# Patient Record
Sex: Male | Born: 2000
Health system: Southern US, Community
[De-identification: ages and names within clinical notes are randomized; demographics above are authoritative.]

## PROBLEM LIST (undated history)

## (undated) DIAGNOSIS — F419 Anxiety disorder, unspecified: Secondary | ICD-10-CM

## (undated) HISTORY — DX: Anxiety disorder, unspecified: F41.9

## (undated) HISTORY — PX: APPENDECTOMY: SHX54

---

## 2021-01-08 ENCOUNTER — Encounter: Payer: Self-pay | Admitting: Nurse Practitioner

## 2021-01-08 ENCOUNTER — Other Ambulatory Visit: Payer: Self-pay

## 2021-01-08 ENCOUNTER — Ambulatory Visit: Payer: Federal, State, Local not specified - PPO | Admitting: Nurse Practitioner

## 2021-01-08 VITALS — BP 134/80 | HR 95 | Temp 98.0°F | Ht 71.0 in | Wt 164.4 lb

## 2021-01-08 DIAGNOSIS — F411 Generalized anxiety disorder: Secondary | ICD-10-CM

## 2021-01-08 DIAGNOSIS — F410 Panic disorder [episodic paroxysmal anxiety] without agoraphobia: Secondary | ICD-10-CM

## 2021-01-08 MED ORDER — SERTRALINE HCL 25 MG PO TABS: 25.0000 mg | ORAL_TABLET | Freq: Every day | ORAL | 2 refills | Status: DC

## 2021-01-08 MED ORDER — HYDROXYZINE HCL 25 MG PO TABS: 25.0000 mg | ORAL_TABLET | Freq: Three times a day (TID) | ORAL | 0 refills | Status: DC | PRN

## 2021-01-08 NOTE — Patient Instructions (Signed)
Thank you for choosing [facility].  You will be contacted to schedule an appt with therapist  I instructed pt to start 1/2 tablet once daily for 1 week and then increase to a full tablet once daily continuously. Take with food. We discussed common side effects such as nausea, drowsiness and weight gain.  Also discussed rare but serious side effect of suicide ideation.  She is instructed to discontinue medication go directly to ED if this occurs.  Pt verbalizes understanding.   Plan follow up in 2weeks to evaluate progress.    Managing Panic Attacks, Teen A panic attack is a sudden and severe episode of anxiety along with physical symptoms like sweating, shaking, and shortness of breath. During a panic attack, you may feel like you are having a heart attack or that you cannot breathe. A panic attack may last 5-10 minutes. These attacks may come on suddenly without any obvious cause and then pass. It is important to know that you can learn ways to manage panic attacks. If you have frequent panic attacks, you may have a panic disorder. Part of having a panic disorder is being constantly afraid of having another panic attack. Sometimes medicines may be used to treat panic attacks or panic disorder in teens. How to recognize a panic attack To be diagnosed with panic attacks, you must have at least four of the following physical or emotional symptoms, and they must start suddenly. Physical symptoms:  Pounding heartbeats, chest pain or pressure, and shortness of breath.  A choking sensation.  Sweating or redness in the face or chest (hot flushing).  Shaking, trembling, or chills.  Nausea or indigestion.  Dizziness.  Numbness and tingling. Emotional symptoms:  Feeling confused or out of body.  Fear of dying or going crazy.  Worried, nervous, and feeling out of control.  Fear of having another panic attack. How to manage a panic attack If you have a panic attack, it is  important to remember that panic attacks do not last long and are not dangerous. After a panic attack, talk about your fears and anxiety with a parent or another trusted adult. Over time and with support, you can learn ways to manage your panic attacks. Suggestions for managing your anxiety and panic attacks include:  Remembering to take deep breaths during a panic attack. You are not in physical danger.  Talking to a trusted adult. Sometimes, a friend's parent, a Runner, broadcasting/film/video, or a coach will find resources to get you the help you need.  Identifying problems you need help fixing, such as being bullied at school.  Learning to recognize things that may lead to panic attacks (triggers). Once you know your triggers, talk about them and find new ways to deal with them.  Making time to do relaxing activities, such as listening to music or reading.  Limiting the amount of time you spend on social media.  Being physically active. Exercise is a good way to manage stress and fear. Try going for a walk or getting involved in an organized sport.  Finding things that help you relax, like yoga, deep breathing, and meditation.   Follow these instructions at home: Eating and drinking  Eat a healthy diet. ? Include foods that are high in fiber, such as beans, whole grains, and fresh fruits and vegetables. ? Limit foods that are high in fat and processed sugars, such as fried or sweet foods.  Limit caffeine.   Activity  Do your normal activities as told  by your health care provider.  Ask your health care provider to suggest some activities.  Try activities that reduce stress and anxiety.  Be physically active every day. General instructions  Take over-the-counter and prescription medicines only as told by your health care provider.  Get enough sleep and try to keep a regular schedule.  Do not smoke or use alcohol or drugs to manage panic attacks.  Keep all follow-up visits as told by your health  care provider. This is important. Where to find support A mental health care provider, like a psychologist or psychiatrist, can help you learn the skills to manage panic attacks. Where to find more information Go to these websites to find more information about how to manage panic attacks:  American Academy of Pediatrics: healthychildren.org  American Academy of Child & Adolescent Psychiatry: [employer]or[employer].es  [facility]: childmind.org Contact a health care provider if:  You keep having panic attacks.  You have signs of anxiety or a panic disorder.  Your panic attacks interfere with your ability to function at home, at school, or with friends.  You are using drugs or alcohol. Get help right away if:  You may be a danger to yourself or others.  You have thoughts about death or suicide. If you ever feel like you may hurt yourself or others, or have thoughts about taking your own life, get help right away. Go to your nearest emergency department or:  Call your local emergency services (911 in the [place]  Call a suicide crisis helpline, such as the National Suicide Prevention Lifeline at (228)343-4766. This is open 24 hours a day in the [place]  Text the Crisis Text Line at 4636768892 (in the [place] Summary  A panic attack is a sudden and severe episode of anxiety along with physical symptoms like sweating, shaking, and shortness of breath.  During a panic attack, the most important thing to remember is that panic attacks do not last long and are not dangerous.  If you have frequent panic attacks, you may have a panic disorder.  It is possible to learn ways to manage panic attacks. Your parents or another trusted adult can help. This information is not intended to replace advice given to you by your health care provider. Make sure you discuss any questions you have with your health care provider. Document Revised: 08/03/2019 Document Reviewed: 08/03/2019 Elsevier Patient  Education  2021 [employer].

## 2021-01-08 NOTE — Progress Notes (Signed)
Subjective:  Patient ID: Willie Mcdaniel, male    DOB: [birth year]  Age: 20 y.o. MRN: [id number]  CC: Establish Care (New patient/Pt states he would like to discuss anxiety. Pt states he experienced what he thinks is a panic attack 10/31/2020 and he his noticed his anxiety more since that initial attack. )  moved from [place], works at [employer], lives with his father at this time. previous use of marijuana and Molly, last used 10/2020.  Anxiety Presents for initial visit. Onset was 1 to 5 years ago. The problem has been gradually worsening. Symptoms include chest pain, depressed mood, dizziness, excessive worry, hyperventilation, muscle tension, nervous/anxious behavior, obsessions, palpitations, panic and restlessness. Patient reports no compulsions, confusion, decreased concentration, dry mouth, feeling of choking, insomnia, irritability, malaise, nausea, shortness of breath or suicidal ideas. Symptoms occur occasionally. The most recent episode lasted 10 minutes. The severity of symptoms is causing significant distress, interfering with daily activities and incapacitating. The symptoms are aggravated by social activities (use of [employer] and marijuana).   Risk factors include family history and illicit drug use. His past medical history is significant for anxiety/panic attacks and depression. There is no history of bipolar disorder, hyperthyroidism or suicide attempts. Past treatments include nothing.  no improvement with vistaril  Depression screen [facility] 2/9 01/08/2021  Decreased Interest 2  Down, Depressed, Hopeless 2  PHQ - 2 Score 4  Altered sleeping 3  Tired, decreased energy 2  Change in appetite 3  Feeling bad or failure about yourself  3  Trouble concentrating 3  Moving slowly or fidgety/restless 1  Suicidal thoughts 0  PHQ-9 Score 19  Difficult doing work/chores Very difficult   GAD 7 : Generalized Anxiety Score 01/08/2021  Nervous, Anxious, on Edge 3  Control/stop worrying  3  Worry too much - different things 3  Trouble relaxing 3  Restless 3  Easily annoyed or irritable 2  Afraid - awful might happen 3  Total GAD 7 Score 20  Anxiety Difficulty Very difficult   Social History   Socioeconomic History  . Marital status: Single    Spouse name: Not on file  . Number of children: Not on file  . Years of education: Not on file  . Highest education level: Not on file  Occupational History  . Not on file  Tobacco Use  . Smoking status: Never Smoker  . Smokeless tobacco: Never Used  Vaping Use  . Vaping Use: Some days  Substance and Sexual Activity  . Alcohol use: Never  . Drug use: Never  . Sexual activity: Not Currently  Other Topics Concern  . Not on file  Social History Narrative  . Not on file   Social Determinants of Health   Financial Resource Strain: Not on file  Food Insecurity: Not on file  Transportation Needs: Not on file  Physical Activity: Not on file  Stress: Not on file  Social Connections: Not on file  Intimate Partner Violence: Not on file   Family History  Problem Relation Age of Onset  . Hypertension Mother   . Hypertension Father   . Hyperlipidemia Father   . Anxiety disorder Father   . Depression Father   . Alcohol abuse Father   . Migraines Father   . Depression Paternal Aunt   . Alcohol abuse Paternal Uncle   . Depression Paternal Uncle     Outpatient Medications Prior to Visit  Medication Sig Dispense Refill  . hydrOXYzine (ATARAX/VISTARIL) 25 MG tablet Take 1  tablet by mouth every 8 (eight) hours as needed. (Patient not taking: Reported on 01/08/2021)     No facility-administered medications prior to visit.   Social History   Socioeconomic History  . Marital status: Single    Spouse name: Not on file  . Number of children: Not on file  . Years of education: Not on file  . Highest education level: Not on file  Occupational History  . Not on file  Tobacco Use  . Smoking status: Never Smoker  .  Smokeless tobacco: Never Used  Vaping Use  . Vaping Use: Some days  Substance and Sexual Activity  . Alcohol use: Never  . Drug use: Never  . Sexual activity: Not Currently  Other Topics Concern  . Not on file  Social History Narrative  . Not on file   Social Determinants of Health   Financial Resource Strain: Not on file  Food Insecurity: Not on file  Transportation Needs: Not on file  Physical Activity: Not on file  Stress: Not on file  Social Connections: Not on file  Intimate Partner Violence: Not on file   ROS See HPI  Objective:  BP 134/80 (BP Location: Left Arm, Patient Position: Sitting, Cuff Size: Large)   Pulse 95   Temp 98 F (36.7 C) (Temporal)   Ht 5\' 11"  (1.803 m)   Wt 164 lb 6.4 oz (74.6 kg)   SpO2 98%   BMI 22.93 kg/m   Physical Exam  Assessment & Plan:  This visit occurred during the SARS-CoV-2 public health emergency.  Safety protocols were in place, including screening questions prior to the visit, additional usage of staff PPE, and extensive cleaning of exam room while observing appropriate contact time as indicated for disinfecting solutions.   Yvonne was seen today for establish care.  Diagnoses and all orders for this visit:  Generalized anxiety disorder with panic attacks -     sertraline (ZOLOFT) 25 MG tablet; Take 1 tablet (25 mg total) by mouth daily. -     Ambulatory referral to Psychology -     hydrOXYzine (ATARAX/VISTARIL) 25 MG tablet; Take 1 tablet (25 mg total) by mouth every 8 (eight) hours as needed.    Problem List Items Addressed This Visit   None   Visit Diagnoses    Generalized anxiety disorder with panic attacks    -  Primary   Relevant Medications   sertraline (ZOLOFT) 25 MG tablet   hydrOXYzine (ATARAX/VISTARIL) 25 MG tablet   Other Relevant Orders   Ambulatory referral to Psychology      I have spent [place] Booty with this patient regarding history taking, documentation, review of ED notes and labs, formulating plan  and discussing treatment options with patient.  Follow-up: Return in about 1 week (around 01/15/2021) for anxiety (03/15/2021).  , NP

## 2021-01-16 ENCOUNTER — Ambulatory Visit: Payer: Federal, State, Local not specified - PPO | Admitting: Nurse Practitioner

## 2021-01-16 ENCOUNTER — Encounter: Payer: Self-pay | Admitting: Nurse Practitioner

## 2021-01-16 ENCOUNTER — Other Ambulatory Visit: Payer: Self-pay

## 2021-01-16 VITALS — BP 124/84 | HR 84 | Temp 97.7°F | Ht 71.0 in | Wt 166.4 lb

## 2021-01-16 DIAGNOSIS — R1013 Epigastric pain: Secondary | ICD-10-CM | POA: Diagnosis not present

## 2021-01-16 DIAGNOSIS — F410 Panic disorder [episodic paroxysmal anxiety] without agoraphobia: Secondary | ICD-10-CM | POA: Diagnosis not present

## 2021-01-16 DIAGNOSIS — F411 Generalized anxiety disorder: Secondary | ICD-10-CM | POA: Diagnosis not present

## 2021-01-16 DIAGNOSIS — K5900 Constipation, unspecified: Secondary | ICD-10-CM | POA: Diagnosis not present

## 2021-01-16 MED ORDER — POLYETHYLENE GLYCOL 3350 17 GM/SCOOP PO POWD: 17.0000 g | Freq: Every day | ORAL | 1 refills | Status: DC

## 2021-01-16 MED ORDER — OMEPRAZOLE 20 MG PO CPDR: DELAYED_RELEASE_CAPSULE | ORAL | 0 refills | Status: DC

## 2021-01-16 NOTE — Patient Instructions (Signed)
Continue zoloft Start miralax and omeprazole Make dietary changes as discussed  Constipation, Adult Constipation is when a person has fewer than three bowel movements in a week, has difficulty having a bowel movement, or has stools (feces) that are dry, hard, or larger than normal. Constipation may be caused by an underlying condition. It may become worse with age if a person takes certain medicines and does not take in enough fluids. Follow these instructions at home: Eating and drinking  Eat foods that have a lot of fiber, such as beans, whole grains, and fresh fruits and vegetables.  Limit foods that are low in fiber and high in fat and processed sugars, such as fried or sweet foods. These include french fries, hamburgers, cookies, candies, and soda.  Drink enough fluid to keep your urine pale yellow.   General instructions  Exercise regularly or as told by your health care provider. Try to do 150 minutes of moderate exercise each week.  Use the bathroom when you have the urge to go. Do not hold it in.  Take over-the-counter and prescription medicines only as told by your health care provider. This includes any fiber supplements.  During bowel movements: ? Practice deep breathing while relaxing the lower abdomen. ? Practice pelvic floor relaxation.  Watch your condition for any changes. Let your health care provider know about them.  Keep all follow-up visits as told by your health care provider. This is important. Contact a health care provider if:  You have pain that gets worse.  You have a fever.  You do not have a bowel movement after 4 days.  You vomit.  You are not hungry or you lose weight.  You are bleeding from the opening between the buttocks (anus).  You have thin, pencil-like stools. Get help right away if:  You have a fever and your symptoms suddenly get worse.  You leak stool or have blood in your stool.  Your abdomen is bloated.  You have severe  pain in your abdomen.  You feel dizzy or you faint. Summary  Constipation is when a person has fewer than three bowel movements in a week, has difficulty having a bowel movement, or has stools (feces) that are dry, hard, or larger than normal.  Eat foods that have a lot of fiber, such as beans, whole grains, and fresh fruits and vegetables.  Drink enough fluid to keep your urine pale yellow.  Take over-the-counter and prescription medicines only as told by your health care provider. This includes any fiber supplements. This information is not intended to replace advice given to you by your health care provider. Make sure you discuss any questions you have with your health care provider. Document Revised: 10/18/2019 Document Reviewed: 10/18/2019 Elsevier Patient Education  2021 [employer].

## 2021-01-16 NOTE — Progress Notes (Signed)
Subjective:  Patient ID: Willie Mcdaniel, male    DOB: 07/06/2001  Age: 20 y.o. MRN: [id number]  CC: Follow-up (1 week f/u regarding anxiety and depression)  Constipation This is a new problem. The current episode started more than 1 month ago. The problem is unchanged. His stool frequency is 2 to 3 times per week. The stool is described as pellet like and firm. The patient is on a high fiber diet. He does not exercise regularly. There has been adequate water intake. Associated symptoms include abdominal pain, bloating, flatus and nausea. Pertinent negatives include no anorexia, back pain, diarrhea, difficulty urinating, fecal incontinence, fever, hematochezia, hemorrhoids, melena, rectal pain, vomiting or weight loss. Risk factors include stress. He has tried laxatives and fiber for the symptoms. The treatment provided no relief. His past medical history is significant for psychiatric history. There is no history of abdominal surgery, endocrine disease, inflammatory bowel disease, irritable bowel syndrome, metabolic disease, neurologic disease, neuromuscular disease or radiation treatment.  Gastroesophageal Reflux He complains of abdominal pain, belching, chest pain, globus sensation, heartburn and nausea. He reports no choking, no coughing, no dysphagia, no early satiety, no hoarse voice, no sore throat, no stridor, no tooth decay, no water brash or no wheezing. This is a recurrent problem. The current episode started more than 1 month ago. The problem occurs constantly. The problem has been waxing and waning. The heartburn is located in the substernum. The heartburn is of moderate intensity. The heartburn does not wake him from sleep. The heartburn does not limit his activity. Nothing aggravates the symptoms. Pertinent negatives include no anemia, fatigue, melena, muscle weakness, orthopnea or weight loss. Risk factors include ETOH use, smoking/tobacco exposure, lack of exercise, NSAIDs and caffeine use.  He has tried an antacid for the symptoms. The treatment provided no relief. Past procedures do not include an abdominal ultrasound, an EGD, esophageal manometry, esophageal pH monitoring, H. pylori antibody titer or a UGI. Past invasive treatments do not include gastroplasty, gastroplication or reflux surgery.   Reviewed past Medical, Social and Family history today.  Outpatient Medications Prior to Visit  Medication Sig Dispense Refill  . hydrOXYzine (ATARAX/VISTARIL) 25 MG tablet Take 1 tablet (25 mg total) by mouth every 8 (eight) hours as needed. 30 tablet 0  . sertraline (ZOLOFT) 25 MG tablet Take 1 tablet (25 mg total) by mouth daily. 30 tablet 2   No facility-administered medications prior to visit.    ROS See HPI  Objective:  BP 124/84 (BP Location: Left Arm, Patient Position: Sitting, Cuff Size: Normal)   Pulse 84   Temp 97.7 F (36.5 C) (Temporal)   Ht 5\' 11"  (1.803 m)   Wt 166 lb 6.4 oz (75.5 kg)   SpO2 98%   BMI 23.21 kg/m   Physical Exam Constitutional:      General: He is not in acute distress. Cardiovascular:     Rate and Rhythm: Normal rate.     Pulses: Normal pulses.  Pulmonary:     Effort: Pulmonary effort is normal.  Abdominal:     General: Bowel sounds are normal.     Palpations: Abdomen is soft.     Tenderness: There is no abdominal tenderness. There is no right CVA tenderness, left CVA tenderness or guarding.  Skin:    Findings: No erythema or rash.  Neurological:     Mental Status: He is alert and oriented to person, place, and time.    Assessment & Plan:  This visit occurred during the SARS-CoV-2  public health emergency.  Safety protocols were in place, including screening questions prior to the visit, additional usage of staff PPE, and extensive cleaning of exam room while observing appropriate contact time as indicated for disinfecting solutions.   Willie Mcdaniel was seen today for follow-up.  Diagnoses and all orders for this visit:  Dyspepsia -      omeprazole (PRILOSEC) 20 MG capsule; Take 1 capsule (20 mg total) by mouth 2 (two) times daily before a meal for 14 days, THEN 1 capsule (20 mg total) daily for 14 days.  Generalized anxiety disorder with panic attacks  Constipation, unspecified constipation type -     polyethylene glycol powder (GLYCOLAX/MIRALAX) 17 GM/SCOOP powder; Take 17 g by mouth daily.   Problem List Items Addressed This Visit      Other   Generalized anxiety disorder with panic attacks    Other Visit Diagnoses    Dyspepsia    -  Primary   Relevant Medications   omeprazole (PRILOSEC) 20 MG capsule   Constipation, unspecified constipation type       Relevant Medications   polyethylene glycol powder (GLYCOLAX/MIRALAX) 17 GM/SCOOP powder      Follow-up: Return in about 4 weeks (around 02/13/2021) for Anxiety, constipation and dyspepsia ( ).  Alysia Penna, NP

## 2021-01-23 DIAGNOSIS — K080 Exfoliation of teeth due to systemic causes: Secondary | ICD-10-CM | POA: Diagnosis not present

## 2021-02-17 ENCOUNTER — Ambulatory Visit: Payer: Federal, State, Local not specified - PPO | Admitting: Nurse Practitioner

## 2021-02-17 ENCOUNTER — Other Ambulatory Visit: Payer: Self-pay

## 2021-02-17 ENCOUNTER — Encounter: Payer: Self-pay | Admitting: Nurse Practitioner

## 2021-02-17 VITALS — BP 118/84 | HR 76 | Temp 97.7°F | Ht 71.0 in | Wt 172.2 lb

## 2021-02-17 DIAGNOSIS — M5386 Other specified dorsopathies, lumbar region: Secondary | ICD-10-CM | POA: Diagnosis not present

## 2021-02-17 DIAGNOSIS — F410 Panic disorder [episodic paroxysmal anxiety] without agoraphobia: Secondary | ICD-10-CM | POA: Diagnosis not present

## 2021-02-17 DIAGNOSIS — M5382 Other specified dorsopathies, cervical region: Secondary | ICD-10-CM | POA: Diagnosis not present

## 2021-02-17 DIAGNOSIS — R1013 Epigastric pain: Secondary | ICD-10-CM | POA: Diagnosis not present

## 2021-02-17 DIAGNOSIS — F411 Generalized anxiety disorder: Secondary | ICD-10-CM

## 2021-02-17 DIAGNOSIS — M9901 Segmental and somatic dysfunction of cervical region: Secondary | ICD-10-CM | POA: Diagnosis not present

## 2021-02-17 DIAGNOSIS — M9903 Segmental and somatic dysfunction of lumbar region: Secondary | ICD-10-CM | POA: Diagnosis not present

## 2021-02-17 MED ORDER — SERTRALINE HCL 50 MG PO TABS: 50.0000 mg | ORAL_TABLET | Freq: Every day | ORAL | 5 refills | Status: DC

## 2021-02-17 MED ORDER — OMEPRAZOLE 20 MG PO CPDR: 20.0000 mg | DELAYED_RELEASE_CAPSULE | Freq: Every day | ORAL | 5 refills | Status: DC

## 2021-02-17 NOTE — Patient Instructions (Signed)
Increase zoloft to 50mg  daily Schedule appt with psychology: [phone].

## 2021-02-17 NOTE — Progress Notes (Signed)
Subjective:  Patient ID: Willie Mcdaniel, male    DOB: [birth year]  Age: 20 y.o. MRN: [id number]  CC: Follow-up (4 week f/u on anxiety, constipation and dyspepsia. Pt states he feels like he has some circulation issues because since going to the gym more noticed some swelling and discomfort in forearms when weight lifting. )  HPI  Generalized anxiety disorder with panic attacks No change with zoloft 25mg  No SI/HI/psychosis. Did not schedule appt with therapist.  Increase zoloft to 50mg  Advised to schedule appt with therapist. F/up in 30month  Dyspepsia Improved with omeprazole Refill sent  Depression screen [facility] 2/9 01/08/2021  Decreased Interest 2  Down, Depressed, Hopeless 2  PHQ - 2 Score 4  Altered sleeping 3  Tired, decreased energy 2  Change in appetite 3  Feeling bad or failure about yourself  3  Trouble concentrating 3  Moving slowly or fidgety/restless 1  Suicidal thoughts 0  PHQ-9 Score 19  Difficult doing work/chores Very difficult   GAD 7 : Generalized Anxiety Score 01/08/2021  Nervous, Anxious, on Edge 3  Control/stop worrying 3  Worry too much - different things 3  Trouble relaxing 3  Restless 3  Easily annoyed or irritable 2  Afraid - awful might happen 3  Total GAD 7 Score 20  Anxiety Difficulty Very difficult   Reviewed past Medical, Social and Family history today.  Outpatient Medications Prior to Visit  Medication Sig Dispense Refill  . hydrOXYzine (ATARAX/VISTARIL) 25 MG tablet Take 1 tablet (25 mg total) by mouth every 8 (eight) hours as needed. 30 tablet 0  . polyethylene glycol powder (GLYCOLAX/MIRALAX) 17 GM/SCOOP powder Take 17 g by mouth daily. 3350 g 1  . sertraline (ZOLOFT) 25 MG tablet Take 1 tablet (25 mg total) by mouth daily. 30 tablet 2  . omeprazole (PRILOSEC) 20 MG capsule Take 1 capsule (20 mg total) by mouth 2 (two) times daily before a meal for 14 days, THEN 1 capsule (20 mg total) daily for 14 days. 60 capsule 0   No  facility-administered medications prior to visit.    ROS See HPI  Objective:  BP 118/84 (BP Location: Left Arm, Patient Position: Sitting, Cuff Size: Large)   Pulse 76   Temp 97.7 F (36.5 C) (Temporal)   Ht 5\' 11"  (1.803 m)   Wt 172 lb 3.2 oz (78.1 kg)   SpO2 98%   BMI 24.02 kg/m   Physical Exam Cardiovascular:     Rate and Rhythm: Normal rate.     Pulses: Normal pulses.  Musculoskeletal:        General: No deformity. Normal range of motion.     Right lower leg: No edema.     Left lower leg: No edema.  Skin:    General: Skin is warm and dry.  Neurological:     Mental Status: He is alert and oriented to person, place, and time.  Psychiatric:        Attention and Perception: Attention normal.        Mood and Affect: Mood is anxious.        Speech: Speech normal.        Behavior: Behavior is cooperative.        Thought Content: Thought content normal.        Cognition and Memory: Cognition normal.    Assessment & Plan:  This visit occurred during the SARS-CoV-2 public health emergency.  Safety protocols were in place, including screening questions prior to the visit, additional  usage of staff PPE, and extensive cleaning of exam room while observing appropriate contact time as indicated for disinfecting solutions.   Willie Mcdaniel was seen today for follow-up.  Diagnoses and all orders for this visit:  Generalized anxiety disorder with panic attacks -     sertraline (ZOLOFT) 50 MG tablet; Take 1 tablet (50 mg total) by mouth daily.  Dyspepsia -     omeprazole (PRILOSEC) 20 MG capsule; Take 1 capsule (20 mg total) by mouth daily.   Problem List Items Addressed This Visit      Other   Dyspepsia    Improved with omeprazole Refill sent      Relevant Medications   omeprazole (PRILOSEC) 20 MG capsule   Generalized anxiety disorder with panic attacks - Primary    No change with zoloft 25mg  No SI/HI/psychosis. Did not schedule appt with therapist.  Increase zoloft to  50mg  Advised to schedule appt with therapist. F/up in 49month      Relevant Medications   sertraline (ZOLOFT) 50 MG tablet      Follow-up: Return in about 4 weeks (around 03/17/2021) for CPE (fasting).  2month, NP

## 2021-02-17 NOTE — Assessment & Plan Note (Signed)
No change with zoloft 25mg  No SI/HI/psychosis. Did not schedule appt with therapist.  Increase zoloft to 50mg  Advised to schedule appt with therapist. F/up in 31month

## 2021-02-17 NOTE — Assessment & Plan Note (Signed)
Improved with omeprazole Refill sent

## 2021-02-20 DIAGNOSIS — M9903 Segmental and somatic dysfunction of lumbar region: Secondary | ICD-10-CM | POA: Diagnosis not present

## 2021-02-20 DIAGNOSIS — M5386 Other specified dorsopathies, lumbar region: Secondary | ICD-10-CM | POA: Diagnosis not present

## 2021-02-20 DIAGNOSIS — M5382 Other specified dorsopathies, cervical region: Secondary | ICD-10-CM | POA: Diagnosis not present

## 2021-02-20 DIAGNOSIS — M9901 Segmental and somatic dysfunction of cervical region: Secondary | ICD-10-CM | POA: Diagnosis not present

## 2021-02-24 DIAGNOSIS — M5382 Other specified dorsopathies, cervical region: Secondary | ICD-10-CM | POA: Diagnosis not present

## 2021-02-24 DIAGNOSIS — M9903 Segmental and somatic dysfunction of lumbar region: Secondary | ICD-10-CM | POA: Diagnosis not present

## 2021-02-24 DIAGNOSIS — M5386 Other specified dorsopathies, lumbar region: Secondary | ICD-10-CM | POA: Diagnosis not present

## 2021-02-24 DIAGNOSIS — M9901 Segmental and somatic dysfunction of cervical region: Secondary | ICD-10-CM | POA: Diagnosis not present

## 2021-02-25 DIAGNOSIS — M5382 Other specified dorsopathies, cervical region: Secondary | ICD-10-CM | POA: Diagnosis not present

## 2021-02-25 DIAGNOSIS — M9901 Segmental and somatic dysfunction of cervical region: Secondary | ICD-10-CM | POA: Diagnosis not present

## 2021-02-25 DIAGNOSIS — M9903 Segmental and somatic dysfunction of lumbar region: Secondary | ICD-10-CM | POA: Diagnosis not present

## 2021-02-25 DIAGNOSIS — M5386 Other specified dorsopathies, lumbar region: Secondary | ICD-10-CM | POA: Diagnosis not present

## 2021-02-27 DIAGNOSIS — M9901 Segmental and somatic dysfunction of cervical region: Secondary | ICD-10-CM | POA: Diagnosis not present

## 2021-02-27 DIAGNOSIS — M5382 Other specified dorsopathies, cervical region: Secondary | ICD-10-CM | POA: Diagnosis not present

## 2021-02-27 DIAGNOSIS — M5386 Other specified dorsopathies, lumbar region: Secondary | ICD-10-CM | POA: Diagnosis not present

## 2021-02-27 DIAGNOSIS — M9903 Segmental and somatic dysfunction of lumbar region: Secondary | ICD-10-CM | POA: Diagnosis not present

## 2021-03-03 DIAGNOSIS — M9901 Segmental and somatic dysfunction of cervical region: Secondary | ICD-10-CM | POA: Diagnosis not present

## 2021-03-03 DIAGNOSIS — M9903 Segmental and somatic dysfunction of lumbar region: Secondary | ICD-10-CM | POA: Diagnosis not present

## 2021-03-03 DIAGNOSIS — M5382 Other specified dorsopathies, cervical region: Secondary | ICD-10-CM | POA: Diagnosis not present

## 2021-03-03 DIAGNOSIS — M5386 Other specified dorsopathies, lumbar region: Secondary | ICD-10-CM | POA: Diagnosis not present

## 2021-03-05 DIAGNOSIS — M9901 Segmental and somatic dysfunction of cervical region: Secondary | ICD-10-CM | POA: Diagnosis not present

## 2021-03-05 DIAGNOSIS — M5382 Other specified dorsopathies, cervical region: Secondary | ICD-10-CM | POA: Diagnosis not present

## 2021-03-05 DIAGNOSIS — M9903 Segmental and somatic dysfunction of lumbar region: Secondary | ICD-10-CM | POA: Diagnosis not present

## 2021-03-05 DIAGNOSIS — M5386 Other specified dorsopathies, lumbar region: Secondary | ICD-10-CM | POA: Diagnosis not present

## 2021-03-06 DIAGNOSIS — M5386 Other specified dorsopathies, lumbar region: Secondary | ICD-10-CM | POA: Diagnosis not present

## 2021-03-06 DIAGNOSIS — M9901 Segmental and somatic dysfunction of cervical region: Secondary | ICD-10-CM | POA: Diagnosis not present

## 2021-03-06 DIAGNOSIS — M9903 Segmental and somatic dysfunction of lumbar region: Secondary | ICD-10-CM | POA: Diagnosis not present

## 2021-03-06 DIAGNOSIS — M5382 Other specified dorsopathies, cervical region: Secondary | ICD-10-CM | POA: Diagnosis not present

## 2021-03-10 DIAGNOSIS — M9901 Segmental and somatic dysfunction of cervical region: Secondary | ICD-10-CM | POA: Diagnosis not present

## 2021-03-10 DIAGNOSIS — M9903 Segmental and somatic dysfunction of lumbar region: Secondary | ICD-10-CM | POA: Diagnosis not present

## 2021-03-10 DIAGNOSIS — M5382 Other specified dorsopathies, cervical region: Secondary | ICD-10-CM | POA: Diagnosis not present

## 2021-03-10 DIAGNOSIS — M5386 Other specified dorsopathies, lumbar region: Secondary | ICD-10-CM | POA: Diagnosis not present

## 2021-03-13 DIAGNOSIS — M5382 Other specified dorsopathies, cervical region: Secondary | ICD-10-CM | POA: Diagnosis not present

## 2021-03-13 DIAGNOSIS — M9903 Segmental and somatic dysfunction of lumbar region: Secondary | ICD-10-CM | POA: Diagnosis not present

## 2021-03-13 DIAGNOSIS — M9901 Segmental and somatic dysfunction of cervical region: Secondary | ICD-10-CM | POA: Diagnosis not present

## 2021-03-13 DIAGNOSIS — M5386 Other specified dorsopathies, lumbar region: Secondary | ICD-10-CM | POA: Diagnosis not present

## 2021-03-17 DIAGNOSIS — M9901 Segmental and somatic dysfunction of cervical region: Secondary | ICD-10-CM | POA: Diagnosis not present

## 2021-03-17 DIAGNOSIS — M5382 Other specified dorsopathies, cervical region: Secondary | ICD-10-CM | POA: Diagnosis not present

## 2021-03-17 DIAGNOSIS — M5386 Other specified dorsopathies, lumbar region: Secondary | ICD-10-CM | POA: Diagnosis not present

## 2021-03-17 DIAGNOSIS — M9903 Segmental and somatic dysfunction of lumbar region: Secondary | ICD-10-CM | POA: Diagnosis not present

## 2021-03-20 DIAGNOSIS — M9903 Segmental and somatic dysfunction of lumbar region: Secondary | ICD-10-CM | POA: Diagnosis not present

## 2021-03-20 DIAGNOSIS — M5386 Other specified dorsopathies, lumbar region: Secondary | ICD-10-CM | POA: Diagnosis not present

## 2021-03-20 DIAGNOSIS — M9901 Segmental and somatic dysfunction of cervical region: Secondary | ICD-10-CM | POA: Diagnosis not present

## 2021-03-20 DIAGNOSIS — M5382 Other specified dorsopathies, cervical region: Secondary | ICD-10-CM | POA: Diagnosis not present

## 2021-04-02 DIAGNOSIS — M9903 Segmental and somatic dysfunction of lumbar region: Secondary | ICD-10-CM | POA: Diagnosis not present

## 2021-04-02 DIAGNOSIS — M5382 Other specified dorsopathies, cervical region: Secondary | ICD-10-CM | POA: Diagnosis not present

## 2021-04-02 DIAGNOSIS — M5386 Other specified dorsopathies, lumbar region: Secondary | ICD-10-CM | POA: Diagnosis not present

## 2021-04-02 DIAGNOSIS — M9901 Segmental and somatic dysfunction of cervical region: Secondary | ICD-10-CM | POA: Diagnosis not present

## 2021-04-11 ENCOUNTER — Encounter: Payer: Self-pay | Admitting: Nurse Practitioner

## 2021-04-14 NOTE — Telephone Encounter (Signed)
Please see message and advise.  Thank you. Last OV 02/17/21

## 2021-04-17 DIAGNOSIS — M9901 Segmental and somatic dysfunction of cervical region: Secondary | ICD-10-CM | POA: Diagnosis not present

## 2021-04-17 DIAGNOSIS — M5382 Other specified dorsopathies, cervical region: Secondary | ICD-10-CM | POA: Diagnosis not present

## 2021-04-17 DIAGNOSIS — M9903 Segmental and somatic dysfunction of lumbar region: Secondary | ICD-10-CM | POA: Diagnosis not present

## 2021-04-17 DIAGNOSIS — M5386 Other specified dorsopathies, lumbar region: Secondary | ICD-10-CM | POA: Diagnosis not present

## 2021-04-18 ENCOUNTER — Other Ambulatory Visit: Payer: Self-pay

## 2021-04-18 ENCOUNTER — Encounter: Payer: Self-pay | Admitting: Nurse Practitioner

## 2021-04-18 ENCOUNTER — Ambulatory Visit (INDEPENDENT_AMBULATORY_CARE_PROVIDER_SITE_OTHER): Payer: Federal, State, Local not specified - PPO | Admitting: Nurse Practitioner

## 2021-04-18 VITALS — BP 110/60 | HR 80 | Temp 98.4°F | Ht 70.5 in | Wt 180.0 lb

## 2021-04-18 DIAGNOSIS — F411 Generalized anxiety disorder: Secondary | ICD-10-CM | POA: Diagnosis not present

## 2021-04-18 DIAGNOSIS — F410 Panic disorder [episodic paroxysmal anxiety] without agoraphobia: Secondary | ICD-10-CM

## 2021-04-18 DIAGNOSIS — Z0001 Encounter for general adult medical examination with abnormal findings: Secondary | ICD-10-CM

## 2021-04-18 LAB — COMPREHENSIVE METABOLIC PANEL
ALT: 19 U/L (ref 0–53)
AST: 30 U/L (ref 0–37)
Albumin: 4.8 g/dL (ref 3.5–5.2)
Alkaline Phosphatase: 78 U/L (ref 39–117)
BUN: 26 mg/dL — ABNORMAL HIGH (ref 6–23)
CO2: 30 mEq/L (ref 19–32)
Calcium: 10 mg/dL (ref 8.4–10.5)
Chloride: 102 mEq/L (ref 96–112)
Creatinine, Ser: 1.19 mg/dL (ref 0.40–1.50)
GFR: 88.1 mL/min (ref >60.00)
Glucose, Bld: 87 mg/dL (ref 70–99)
Potassium: 3.8 mEq/L (ref 3.5–5.1)
Sodium: 140 mEq/L (ref 135–145)
Total Bilirubin: 0.5 mg/dL (ref 0.2–1.2)
Total Protein: 7.3 g/dL (ref 6.0–8.3)

## 2021-04-18 LAB — CBC
HCT: 45.7 % (ref 39.0–52.0)
Hemoglobin: 16.2 g/dL (ref 13.0–17.0)
MCHC: 35.5 g/dL (ref 30.0–36.0)
MCV: 83 fl (ref 78.0–100.0)
Platelets: 193 10*3/uL (ref 150.0–400.0)
RBC: 5.5 Mil/uL (ref 4.22–5.81)
RDW: 13 % (ref 11.5–14.6)
WBC: 8.2 10*3/uL (ref 4.5–10.5)

## 2021-04-18 LAB — TSH: TSH: 4.68 u[IU]/mL (ref 0.35–5.50)

## 2021-04-18 MED ORDER — BUSPIRONE HCL 7.5 MG PO TABS: 7.5000 mg | ORAL_TABLET | Freq: Two times a day (BID) | ORAL | 5 refills | Status: DC

## 2021-04-18 NOTE — Progress Notes (Signed)
Subjective:    Patient ID: Willie Mcdaniel, male    DOB: 12/20/2000, 20 y.o.   MRN: [id number]  Patient presents today for CPE and eval of chronic conditions  HPI Generalized anxiety disorder with panic attacks Improving with zoloft 50mg  Intermittent panic attacks He is still to schedule appt with therapist. No HI/SI, no hallucination  Advised to schedule appt with therapist. Provided number. Maintain zoloft dose Add buspar 7.5mg  BID F/up in 22month  Sexual History (orientation,birth control, marital status, STD):denies need for STD screen  Depression/Suicide: Depression screen [facility] 2/9 04/18/2021 02/17/2021 01/08/2021  Decreased Interest 1 1 2   Down, Depressed, Hopeless 1 2 2   PHQ - 2 Score 2 3 4   Altered sleeping 2 1 3   Tired, decreased energy 2 3 2   Change in appetite 1 3 3   Feeling bad or failure about yourself  1 1 3   Trouble concentrating 1 2 3   Moving slowly or fidgety/restless 1 1 1   Suicidal thoughts 0 0 0  PHQ-9 Score 10 14 19   Difficult doing work/chores Somewhat difficult Very difficult Very difficult   GAD 7 : Generalized Anxiety Score 04/18/2021 02/17/2021 01/08/2021  Nervous, Anxious, on Edge 3 3 3   Control/stop worrying 1 2 3   Worry too much - different things 2 2 3   Trouble relaxing 2 3 3   Restless 1 1 3   Easily annoyed or irritable 1 3 2   Afraid - awful might happen 1 1 3   Total GAD 7 Score 11 15 20   Anxiety Difficulty Somewhat difficult Very difficult Very difficult   Vision:not needed  Dental:will schedule  Immunizations: (TDAP, Hep C screen, Pneumovax, Influenza, zoster)  Health Maintenance  Topic Date Due  . HPV Vaccine (1 - Male 2-dose series) Never done  . Tetanus Vaccine  Never done  . Hepatitis C Screening: USPSTF Recommendation to screen - Ages 18-79 yo.  04/18/2022*  . HIV Screening  04/18/2022*  . COVID-19 Vaccine (3 - Booster for Moderna series) 07/08/2021  . Flu Shot  07/14/2021  *Topic was postponed. The date shown is not the original due  date.   Diet:regular. Exercise: none Weight:  Wt Readings from Last 3 Encounters:  04/18/21 180 lb (81.6 kg)  02/17/21 172 lb 3.2 oz (78.1 kg)  01/16/21 166 lb 6.4 oz (75.5 kg) (66 %, Z= 0.40)*   * Growth percentiles are based on CDC (Boys, 2-20 Years) data.   Fall Risk: Fall Risk  04/18/2021  Falls in the past year? 0  Number falls in past yr: 0  Injury with Fall? 0  Risk for fall due to : No Fall Risks  Follow up Falls evaluation completed   Medications and allergies reviewed with patient and updated if appropriate.  Patient Active Problem List   Diagnosis Date Noted  . Dyspepsia 02/17/2021  . Generalized anxiety disorder with panic attacks 01/16/2021    Current Outpatient Medications on File Prior to Visit  Medication Sig Dispense Refill  . polyethylene glycol powder (GLYCOLAX/MIRALAX) 17 GM/SCOOP powder Take 17 g by mouth daily. 3350 g 1  . sertraline (ZOLOFT) 50 MG tablet Take 1 tablet (50 mg total) by mouth daily. 30 tablet 5  . omeprazole (PRILOSEC) 20 MG capsule Take 1 capsule (20 mg total) by mouth daily. 30 capsule 5   No current facility-administered medications on file prior to visit.    Past Medical History:  Diagnosis Date  . Anxiety     Past Surgical History:  Procedure Laterality Date  . APPENDECTOMY  Social History   Socioeconomic History  . Marital status: Single    Spouse name: Not on file  . Number of children: Not on file  . Years of education: Not on file  . Highest education level: Not on file  Occupational History  . Not on file  Tobacco Use  . Smoking status: Never Smoker  . Smokeless tobacco: Never Used  Vaping Use  . Vaping Use: Some days  Substance and Sexual Activity  . Alcohol use: Never  . Drug use: Never  . Sexual activity: Not Currently  Other Topics Concern  . Not on file  Social History Narrative  . Not on file   Social Determinants of Health   Financial Resource Strain: Not on file  Food Insecurity: Not on  file  Transportation Needs: Not on file  Physical Activity: Not on file  Stress: Not on file  Social Connections: Not on file    Family History  Problem Relation Age of Onset  . Hypertension Mother   . Hypertension Father   . Hyperlipidemia Father   . Anxiety disorder Father   . Depression Father   . Alcohol abuse Father   . Migraines Father   . Depression Paternal Aunt   . Alcohol abuse Paternal Uncle   . Depression Paternal Uncle         Review of Systems  Constitutional: Negative for fever, malaise/fatigue and weight loss.  HENT: Negative for congestion and sore throat.   Eyes:       Negative for visual changes  Respiratory: Negative for cough and shortness of breath.   Cardiovascular: Negative for chest pain, palpitations and leg swelling.  Gastrointestinal: Negative for blood in stool, constipation, diarrhea and heartburn.  Genitourinary: Negative for dysuria, frequency and urgency.  Musculoskeletal: Negative for falls, joint pain and myalgias.  Skin: Negative for rash.  Neurological: Negative for dizziness, sensory change and headaches.  Endo/Heme/Allergies: Does not bruise/bleed easily.  Psychiatric/Behavioral: Positive for depression. Negative for hallucinations, substance abuse and suicidal ideas. The patient is nervous/anxious.     Objective:   Vitals:   04/18/21 0941  BP: 110/60  Pulse: 80  Temp: 98.4 F (36.9 C)  SpO2: 98%    Body mass index is 25.46 kg/m.   Physical Examination:  Physical Exam Vitals reviewed.  Constitutional:      General: He is not in acute distress.    Appearance: He is well-developed.  HENT:     Right Ear: Tympanic membrane, ear canal and external ear normal.     Left Ear: Tympanic membrane, ear canal and external ear normal.  Eyes:     Extraocular Movements: Extraocular movements intact.     Conjunctiva/sclera: Conjunctivae normal.  Cardiovascular:     Rate and Rhythm: Normal rate and regular rhythm.     Heart  sounds: Normal heart sounds.  Pulmonary:     Effort: Pulmonary effort is normal. No respiratory distress.     Breath sounds: Normal breath sounds.  Chest:     Chest wall: No tenderness.  Abdominal:     General: Bowel sounds are normal.     Palpations: Abdomen is soft.  Musculoskeletal:        General: Normal range of motion.     Cervical back: Normal range of motion and neck supple.  Skin:    General: Skin is warm and dry.  Neurological:     Mental Status: He is alert and oriented to person, place, and time.  Deep Tendon Reflexes: Reflexes are normal and symmetric.  Psychiatric:        Mood and Affect: Mood normal.        Behavior: Behavior normal.        Thought Content: Thought content normal.    ASSESSMENT and PLAN: This visit occurred during the SARS-CoV-2 public health emergency.  Safety protocols were in place, including screening questions prior to the visit, additional usage of staff PPE, and extensive cleaning of exam room while observing appropriate contact time as indicated for disinfecting solutions.   Lanny was seen today for annual exam.  Diagnoses and all orders for this visit:  Generalized anxiety disorder with panic attacks -     busPIRone (BUSPAR) 7.5 MG tablet; Take 1 tablet (7.5 mg total) by mouth 2 (two) times daily.  Encounter for preventative adult health care exam with abnormal findings -     TSH -     CBC -     Comprehensive metabolic panel      Problem List Items Addressed This Visit      Other   Generalized anxiety disorder with panic attacks - Primary    Improving with zoloft 50mg  Intermittent panic attacks He is still to schedule appt with therapist. No HI/SI, no hallucination  Advised to schedule appt with therapist. Provided number. Maintain zoloft dose Add buspar 7.5mg  BID F/up in 79month      Relevant Medications   busPIRone (BUSPAR) 7.5 MG tablet    Other Visit Diagnoses    Encounter for preventative adult health care  exam with abnormal findings       Relevant Orders   TSH   CBC   Comprehensive metabolic panel      Follow up: Return in about 4 weeks (around 05/16/2021) for anxiety and dyspepsia (07/16/2021).  , NP

## 2021-04-18 NOTE — Patient Instructions (Addendum)
Go to lab to get blood draw  Schedule appt with therapist: [phone].  Start buspar  maintain zoloft dose.  Preventive Care 22-20 Years Old, Male Preventive care refers to lifestyle choices and visits with your health care provider that can promote health and wellness. At this stage in your life, you may start seeing a primary care physician instead of a pediatrician. It is important to take responsibility for your health and well-being. Preventive care for young adults includes:  A yearly physical exam. This is also called an annual wellness visit.  Regular dental and eye exams.  Immunizations.  Screening for certain conditions.  Healthy lifestyle choices, such as: ? Eating a healthy diet. ? Getting regular exercise. ? Not using drugs or products that contain nicotine and tobacco. ? Limiting alcohol use. What can I expect for my preventive care visit? Physical exam Your health care provider may check your:  Height and weight. These may be used to calculate your BMI (body mass index). BMI is a measurement that tells if you are at a healthy weight.  Heart rate and blood pressure.  Body temperature.  Skin for abnormal spots. Counseling Your health care provider may ask you questions about your:  Past medical problems.  Family's medical history.  Alcohol, tobacco, and drug use.  Home life and relationship well-being.  Access to firearms.  Emotional well-being.  Diet, exercise, and sleep habits.  Sexual activity and sexual health. What immunizations do I need? Vaccines are usually given at various ages, according to a schedule. Your health care provider will recommend vaccines for you based on your age, medical history, and lifestyle or other factors, such as travel or where you work.   What tests do I need? Blood tests  Lipid and cholesterol levels. These may be checked every 5 years starting at age 79.  Hepatitis C test.  Hepatitis B  test. Screening  Genital exam to check for testicular cancer or hernias.  STD (sexually transmitted disease) testing, if you are at risk. Other tests  Tuberculosis skin test.  Vision and hearing tests.  Skin exam. Talk with your health care provider about your test results, treatment options, and if necessary, the need for more tests. Follow these instructions at home: Eating and drinking  Eat a healthy diet that includes fresh fruits and vegetables, whole grains, lean protein, and low-fat dairy products.  Drink enough fluid to keep your urine pale yellow.  Do not drink alcohol if: ? Your health care provider tells you not to drink. ? You are under the legal drinking age. In the [place] the legal drinking age is 37.  If you drink alcohol: ? Limit how much you use to 0-2 drinks a day. ? Be aware of how much alcohol is in your drink. In the [place] one drink equals one 12 oz bottle of beer (355 mL), one 5 oz glass of wine (148 mL), or one 1 oz glass of hard liquor (44 mL).   Lifestyle  Take daily care of your teeth and gums. Brush your teeth every morning and night with fluoride toothpaste. Floss one time each day.  Stay active. Exercise for at least 30 minutes 5 or more days of the week.  Do not use any products that contain nicotine or tobacco, such as cigarettes, e-cigarettes, and chewing tobacco. If you need help quitting, ask your health care provider.  Do not use drugs.  If you are sexually active, practice safe sex. Use a condom or  other form of protection to prevent STIs (sexually transmitted infections).  Find healthy ways to cope with stress, such as: ? Meditation, yoga, or listening to music. ? Journaling. ? Talking to a trusted person. ? Spending time with friends and family. Safety  Always wear your seat belt while driving or riding in a vehicle.  Do not drive: ? If you have been drinking alcohol. Do not ride with someone who has been drinking. ? When you  are tired or distracted. ? While texting.  Wear a helmet and other protective equipment during sports activities.  If you have firearms in your house, make sure you follow all gun safety procedures.  Seek help if you have been bullied, physically abused, or sexually abused.  Use the Internet responsibly to avoid dangers, such as online bullying and online sex predators. What's next?  Go to your health care provider once a year for an annual wellness visit.  Ask your health care provider how often you should have your eyes and teeth checked.  Stay up to date on all vaccines. This information is not intended to replace advice given to you by your health care provider. Make sure you discuss any questions you have with your health care provider. Document Revised: 08/16/2019 Document Reviewed: 11/24/2018 Elsevier Patient Education  2021 [employer].

## 2021-04-18 NOTE — Assessment & Plan Note (Addendum)
Improving with zoloft 50mg  Intermittent panic attacks He is still to schedule appt with therapist. No HI/SI, no hallucination  Advised to schedule appt with therapist. Provided number. Maintain zoloft dose Add buspar 7.5mg  BID F/up in 48month

## 2021-05-19 DIAGNOSIS — M5386 Other specified dorsopathies, lumbar region: Secondary | ICD-10-CM | POA: Diagnosis not present

## 2021-05-19 DIAGNOSIS — M5382 Other specified dorsopathies, cervical region: Secondary | ICD-10-CM | POA: Diagnosis not present

## 2021-05-19 DIAGNOSIS — M9901 Segmental and somatic dysfunction of cervical region: Secondary | ICD-10-CM | POA: Diagnosis not present

## 2021-05-19 DIAGNOSIS — M9903 Segmental and somatic dysfunction of lumbar region: Secondary | ICD-10-CM | POA: Diagnosis not present

## 2021-05-23 ENCOUNTER — Encounter: Payer: Self-pay | Admitting: Nurse Practitioner

## 2021-05-23 ENCOUNTER — Ambulatory Visit: Payer: Federal, State, Local not specified - PPO | Admitting: Nurse Practitioner

## 2021-05-23 ENCOUNTER — Other Ambulatory Visit: Payer: Self-pay

## 2021-05-23 ENCOUNTER — Ambulatory Visit (INDEPENDENT_AMBULATORY_CARE_PROVIDER_SITE_OTHER): Payer: Federal, State, Local not specified - PPO | Admitting: Psychologist

## 2021-05-23 VITALS — BP 116/72 | HR 58 | Temp 97.2°F | Ht 70.0 in | Wt 172.0 lb

## 2021-05-23 DIAGNOSIS — G44229 Chronic tension-type headache, not intractable: Secondary | ICD-10-CM | POA: Insufficient documentation

## 2021-05-23 DIAGNOSIS — F320 Major depressive disorder, single episode, mild: Secondary | ICD-10-CM

## 2021-05-23 DIAGNOSIS — F411 Generalized anxiety disorder: Secondary | ICD-10-CM

## 2021-05-23 DIAGNOSIS — F410 Panic disorder [episodic paroxysmal anxiety] without agoraphobia: Secondary | ICD-10-CM

## 2021-05-23 MED ORDER — BUSPIRONE HCL 7.5 MG PO TABS: 7.5000 mg | ORAL_TABLET | Freq: Two times a day (BID) | ORAL | 5 refills | Status: DC

## 2021-05-23 MED ORDER — SERTRALINE HCL 50 MG PO TABS: 50.0000 mg | ORAL_TABLET | Freq: Every day | ORAL | 3 refills | Status: DC

## 2021-05-23 MED ORDER — CYCLOBENZAPRINE HCL 5 MG PO TABS
5.0000 mg | ORAL_TABLET | Freq: Every evening | ORAL | 1 refills | Status: DC | PRN
Start: 2021-05-23 — End: 2022-03-04

## 2021-05-23 NOTE — Patient Instructions (Signed)
Use mouth guard at bedtime Do neck and shoulder exercise daily. Continue other medications  Shoulder Exercises Ask your health care provider which exercises are safe for you. Do exercises exactly as told by your health care provider and adjust them as directed. It is normal to feel mild stretching, pulling, tightness, or discomfort as you do these exercises. Stop right away if you feel sudden pain or your pain gets worse. Do not begin these exercises until told by your health care provider. Stretching exercises External rotation and abduction This exercise is sometimes called corner stretch. This exercise rotates your arm outward (external rotation) and moves your arm out from your body (abduction). Stand in a doorway with one of your feet slightly in front of the other. This is called a staggered stance. If you cannot reach your forearms to the door frame, stand facing a corner of a room. Choose one of the following positions as told by your health care provider: Place your hands and forearms on the door frame above your head. Place your hands and forearms on the door frame at the height of your head. Place your hands on the door frame at the height of your elbows. Slowly move your weight onto your front foot until you feel a stretch across your chest and in the front of your shoulders. Keep your head and chest upright and keep your abdominal muscles tight. Hold for __________ seconds. To release the stretch, shift your weight to your back foot. Repeat __________ times. Complete this exercise __________ times a day. Extension, standing Stand and hold a broomstick, a cane, or a similar object behind your back. Your hands should be a little wider than shoulder width apart. Your palms should face away from your back. Keeping your elbows straight and your shoulder muscles relaxed, move the stick away from your body until you feel a stretch in your shoulders (extension). Avoid shrugging your  shoulders while you move the stick. Keep your shoulder blades tucked down toward the middle of your back. Hold for __________ seconds. Slowly return to the starting position. Repeat __________ times. Complete this exercise __________ times a day. Range-of-motion exercises Pendulum  Stand near a wall or a surface that you can hold onto for balance. Bend at the waist and let your left / right arm hang straight down. Use your other arm to support you. Keep your back straight and do not lock your knees. Relax your left / right arm and shoulder muscles, and move your hips and your trunk so your left / right arm swings freely. Your arm should swing because of the motion of your body, not because you are using your arm or shoulder muscles. Keep moving your hips and trunk so your arm swings in the following directions, as told by your health care provider: Side to side. Forward and backward. In clockwise and counterclockwise circles. Continue each motion for __________ seconds, or for as long as told by your health care provider. Slowly return to the starting position. Repeat __________ times. Complete this exercise __________ times a day. Shoulder flexion, standing  Stand and hold a broomstick, a cane, or a similar object. Place your hands a little more than shoulder width apart on the object. Your left / right hand should be palm up, and your other hand should be palm down. Keep your elbow straight and your shoulder muscles relaxed. Push the stick up with your healthy arm to raise your left / right arm in front of your body,  and then over your head until you feel a stretch in your shoulder (flexion). Avoid shrugging your shoulder while you raise your arm. Keep your shoulder blade tucked down toward the middle of your back. Hold for __________ seconds. Slowly return to the starting position. Repeat __________ times. Complete this exercise __________ times a day. Shoulder abduction, standing Stand  and hold a broomstick, a cane, or a similar object. Place your hands a little more than shoulder width apart on the object. Your left / right hand should be palm up, and your other hand should be palm down. Keep your elbow straight and your shoulder muscles relaxed. Push the object across your body toward your left / right side. Raise your left / right arm to the side of your body (abduction) until you feel a stretch in your shoulder. Do not raise your arm above shoulder height unless your health care provider tells you to do that. If directed, raise your arm over your head. Avoid shrugging your shoulder while you raise your arm. Keep your shoulder blade tucked down toward the middle of your back. Hold for __________ seconds. Slowly return to the starting position. Repeat __________ times. Complete this exercise __________ times a day. Internal rotation  Place your left / right hand behind your back, palm up. Use your other hand to dangle an exercise band, a towel, or a similar object over your shoulder. Grasp the band with your left / right hand so you are holding on to both ends. Gently pull up on the band until you feel a stretch in the front of your left / right shoulder. The movement of your arm toward the center of your body is called internal rotation. Avoid shrugging your shoulder while you raise your arm. Keep your shoulder blade tucked down toward the middle of your back. Hold for __________ seconds. Release the stretch by letting go of the band and lowering your hands. Repeat __________ times. Complete this exercise __________ times a day. Strengthening exercises External rotation  Sit in a stable chair without armrests. Secure an exercise band to a stable object at elbow height on your left / right side. Place a soft object, such as a folded towel or a small pillow, between your left / right upper arm and your body to move your elbow about 4 inches (10 cm) away from your side. Hold  the end of the exercise band so it is tight and there is no slack. Keeping your elbow pressed against the soft object, slowly move your forearm out, away from your abdomen (external rotation). Keep your body steady so only your forearm moves. Hold for __________ seconds. Slowly return to the starting position. Repeat __________ times. Complete this exercise __________ times a day. Shoulder abduction  Sit in a stable chair without armrests, or stand up. Hold a __________ weight in your left / right hand, or hold an exercise band with both hands. Start with your arms straight down and your left / right palm facing in, toward your body. Slowly lift your left / right hand out to your side (abduction). Do not lift your hand above shoulder height unless your health care provider tells you that this is safe. Keep your arms straight. Avoid shrugging your shoulder while you do this movement. Keep your shoulder blade tucked down toward the middle of your back. Hold for __________ seconds. Slowly lower your arm, and return to the starting position. Repeat __________ times. Complete this exercise __________ times a day. Shoulder  extension Sit in a stable chair without armrests, or stand up. Secure an exercise band to a stable object in front of you so it is at shoulder height. Hold one end of the exercise band in each hand. Your palms should face each other. Straighten your elbows and lift your hands up to shoulder height. Step back, away from the secured end of the exercise band, until the band is tight and there is no slack. Squeeze your shoulder blades together as you pull your hands down to the sides of your thighs (extension). Stop when your hands are straight down by your sides. Do not let your hands go behind your body. Hold for __________ seconds. Slowly return to the starting position. Repeat __________ times. Complete this exercise __________ times a day. Shoulder row Sit in a stable chair  without armrests, or stand up. Secure an exercise band to a stable object in front of you so it is at waist height. Hold one end of the exercise band in each hand. Position your palms so that your thumbs are facing the ceiling (neutral position). Bend each of your elbows to a 90-degree angle (right angle) and keep your upper arms at your sides. Step back until the band is tight and there is no slack. Slowly pull your elbows back behind you. Hold for __________ seconds. Slowly return to the starting position. Repeat __________ times. Complete this exercise __________ times a day. Shoulder press-ups  Sit in a stable chair that has armrests. Sit upright, with your feet flat on the floor. Put your hands on the armrests so your elbows are bent and your fingers are pointing forward. Your hands should be about even with the sides of your body. Push down on the armrests and use your arms to lift yourself off the chair. Straighten your elbows and lift yourself up as much as you comfortably can. Move your shoulder blades down, and avoid letting your shoulders move up toward your ears. Keep your feet on the ground. As you get stronger, your feet should support less of your body weight as you lift yourself up. Hold for __________ seconds. Slowly lower yourself back into the chair. Repeat __________ times. Complete this exercise __________ times a day. Wall push-ups  Stand so you are facing a stable wall. Your feet should be about one arm-length away from the wall. Lean forward and place your palms on the wall at shoulder height. Keep your feet flat on the floor as you bend your elbows and lean forward toward the wall. Hold for __________ seconds. Straighten your elbows to push yourself back to the starting position. Repeat __________ times. Complete this exercise __________ times a day. This information is not intended to replace advice given to you by your health care provider. Make sure you discuss  any questions you have with your healthcare provider. Document Revised: 03/24/2019 Document Reviewed: 12/30/2018 Elsevier Patient Education  2022 [employer]   Neck Exercises Ask your health care provider which exercises are safe for you. Do exercises exactly as told by your health care provider and adjust them as directed. It is normal to feel mild stretching, pulling, tightness, or discomfort as you do these exercises. Stop right away if you feel sudden pain or your pain gets worse. Do not begin these exercises until told by your health care provider. Neck exercises can be important for many reasons. They can improve strength and maintain flexibility in your neck, which will help your upper back and preventneck pain.  Stretching exercises Rotation neck stretching  Sit in a chair or stand up. Place your feet flat on the floor, shoulder width apart. Slowly turn your head (rotate) to the right until a slight stretch is felt. Turn it all the way to the right so you can look over your right shoulder. Do not tilt or tip your head. Hold this position for 10-30 seconds. Slowly turn your head (rotate) to the left until a slight stretch is felt. Turn it all the way to the left so you can look over your left shoulder. Do not tilt or tip your head. Hold this position for 10-30 seconds. Repeat __________ times. Complete this exercise __________ times a day. Neck retraction Sit in a sturdy chair or stand up. Look straight ahead. Do not bend your neck. Use your fingers to push your chin backward (retraction). Do not bend your neck for this movement. Continue to face straight ahead. If you are doing the exercise properly, you will feel a slight sensation in your throat and a stretch at the back of your neck. Hold the stretch for 1-2 seconds. Repeat __________ times. Complete this exercise __________ times a day. Strengthening exercises Neck press Lie on your back on a firm bed or on the floor with a  pillow under your head. Use your neck muscles to push your head down on the pillow and straighten your spine. Hold the position as well as you can. Keep your head facing up (in a neutral position) and your chin tucked. Slowly count to 5 while holding this position. Repeat __________ times. Complete this exercise __________ times a day. Isometrics These are exercises in which you strengthen the muscles in your neck while keeping your neck still (isometrics). Sit in a supportive chair and place your hand on your forehead. Keep your head and face facing straight ahead. Do not flex or extend your neck while doing isometrics. Push forward with your head and neck while pushing back with your hand. Hold for 10 seconds. Do the sequence again, this time putting your hand against the back of your head. Use your head and neck to push backward against the hand pressure. Finally, do the same exercise on either side of your head, pushing sideways against the pressure of your hand. Repeat __________ times. Complete this exercise __________ times a day. Prone head lifts Lie face-down (prone position), resting on your elbows so that your chest and upper back are raised. Start with your head facing downward, near your chest. Position your chin either on or near your chest. Slowly lift your head upward. Lift until you are looking straight ahead. Then continue lifting your head as far back as you can comfortably stretch. Hold your head up for 5 seconds. Then slowly lower it to your starting position. Repeat __________ times. Complete this exercise __________ times a day. Supine head lifts Lie on your back (supine position), bending your knees to point to the ceiling and keeping your feet flat on the floor. Lift your head slowly off the floor, raising your chin toward your chest. Hold for 5 seconds. Repeat __________ times. Complete this exercise __________ times a day. Scapular retraction Stand with your arms  at your sides. Look straight ahead. Slowly pull both shoulders (scapulae) backward and downward (retraction) until you feel a stretch between your shoulder blades in your upper back. Hold for 10-30 seconds. Relax and repeat. Repeat __________ times. Complete this exercise __________ times a day. Contact a health care provider if: Your neck  pain or discomfort gets much worse when you do an exercise. Your neck pain or discomfort does not improve within 2 hours after you exercise. If you have any of these problems, stop exercising right away. Do not do the exercises again unless your health care provider says that you can. Get help right away if: You develop sudden, severe neck pain. If this happens, stop exercising right away. Do not do the exercises again unless your health care provider says that you can. This information is not intended to replace advice given to you by your health care provider. Make sure you discuss any questions you have with your healthcare provider. Document Revised: 09/28/2018 Document Reviewed: 09/28/2018 Elsevier Patient Education  2022 [employer].

## 2021-05-23 NOTE — Assessment & Plan Note (Signed)
Ongoing for several years, no head injury, located in occipital region Associated with grinding teeth and jaw tightness No improvement with tylenol or chiropractor adjustment.  Advised to use mouthguard at bedtime Start neck and jaw exercise. Use flexeril at HS prn

## 2021-05-23 NOTE — Assessment & Plan Note (Addendum)
Stable mood with zoloft and buspar Has first appt with psychologist today Denies any adverse side effects  Maintain current medication dose F/up in 18months

## 2021-05-23 NOTE — Progress Notes (Signed)
Subjective:  Patient ID: Willie Mcdaniel, male    DOB: July 19, 2001  Age: 20 y.o. MRN: [id number]  CC: Follow-up (4 week f/u on anxiety and dyspepsia, per pt medications seems to be working fine. C/O of headaches and neck pains that does not seem to be improving seen chiropractor for neck pain. )  HPI  Generalized anxiety disorder with panic attacks Stable mood with zoloft and buspar Has first appt with psychologist today Denies any adverse side effects  Maintain current medication dose F/up in 23months  Chronic tension-type headache, not intractable Ongoing for several years, no head injury, located in occipital region Associated with grinding teeth and jaw tightness No improvement with tylenol or chiropractor adjustment.  Advised to use mouthguard at bedtime Start neck and jaw exercise. Use flexeril at HS prn Depression screen [facility] 2/9 05/23/2021 05/23/2021 04/18/2021  Decreased Interest 1 0 1  Down, Depressed, Hopeless 1 1 1   PHQ - 2 Score 2 1 2   Altered sleeping 2 - 2  Tired, decreased energy 2 - 2  Change in appetite 2 - 1  Feeling bad or failure about yourself  1 - 1  Trouble concentrating 1 - 1  Moving slowly or fidgety/restless 1 - 1  Suicidal thoughts 0 - 0  PHQ-9 Score 11 - 10  Difficult doing work/chores Not difficult at all - Somewhat difficult    Depression screen [facility] 2/9 05/23/2021 05/23/2021 04/18/2021  Decreased Interest 1 0 1  Down, Depressed, Hopeless 1 1 1   PHQ - 2 Score 2 1 2   Altered sleeping 2 - 2  Tired, decreased energy 2 - 2  Change in appetite 2 - 1  Feeling bad or failure about yourself  1 - 1  Trouble concentrating 1 - 1  Moving slowly or fidgety/restless 1 - 1  Suicidal thoughts 0 - 0  PHQ-9 Score 11 - 10  Difficult doing work/chores Not difficult at all - Somewhat difficult    Reviewed past Medical, Social and Family history today.  Outpatient Medications Prior to Visit  Medication Sig Dispense Refill   omeprazole (PRILOSEC) 20 MG capsule Take 1  capsule (20 mg total) by mouth daily. 30 capsule 5   polyethylene glycol powder (GLYCOLAX/MIRALAX) 17 GM/SCOOP powder Take 17 g by mouth daily. 3350 g 1   busPIRone (BUSPAR) 7.5 MG tablet Take 1 tablet (7.5 mg total) by mouth 2 (two) times daily. 60 tablet 5   sertraline (ZOLOFT) 50 MG tablet Take 1 tablet (50 mg total) by mouth daily. 30 tablet 5   No facility-administered medications prior to visit.    ROS See HPI  Objective:  BP 116/72 (BP Location: Left Arm, Patient Position: Sitting, Cuff Size: Normal)   Pulse (!) 58   Temp (!) 97.2 F (36.2 C) (Temporal)   Ht 5\' 10"  (1.778 m)   Wt 172 lb (78 kg)   SpO2 98%   BMI 24.68 kg/m   Physical Exam Vitals reviewed.  Constitutional:      General: He is not in acute distress. HENT:     Head:     Jaw: There is normal jaw occlusion.     Right Ear: Tympanic membrane, ear canal and external ear normal.     Left Ear: Tympanic membrane, ear canal and external ear normal.  Neck:     Thyroid: No thyroid mass, thyromegaly or thyroid tenderness.     Trachea: Trachea and phonation normal.  Cardiovascular:     Pulses: Normal pulses.  Pulmonary:  Effort: Pulmonary effort is normal.  Musculoskeletal:     Cervical back: Normal range of motion and neck supple. No rigidity or tenderness.  Lymphadenopathy:     Cervical: No cervical adenopathy.  Neurological:     Mental Status: He is alert and oriented to person, place, and time.  Psychiatric:        Mood and Affect: Mood normal.        Behavior: Behavior normal.        Thought Content: Thought content normal.   Assessment & Plan:  This visit occurred during the SARS-CoV-2 public health emergency.  Safety protocols were in place, including screening questions prior to the visit, additional usage of staff PPE, and extensive cleaning of exam room while observing appropriate contact time as indicated for disinfecting solutions.   Willie Mcdaniel was seen today for follow-up.  Diagnoses and all  orders for this visit:  Generalized anxiety disorder with panic attacks -     sertraline (ZOLOFT) 50 MG tablet; Take 1 tablet (50 mg total) by mouth daily. -     busPIRone (BUSPAR) 7.5 MG tablet; Take 1 tablet (7.5 mg total) by mouth 2 (two) times daily.  Chronic tension-type headache, not intractable -     cyclobenzaprine (FLEXERIL) 5 MG tablet; Take 1-2 tablets (5-10 mg total) by mouth at bedtime as needed for muscle spasms.  Problem List Items Addressed This Visit       Nervous and Auditory   Chronic tension-type headache, not intractable    Ongoing for several years, no head injury, located in occipital region Associated with grinding teeth and jaw tightness No improvement with tylenol or chiropractor adjustment.  Advised to use mouthguard at bedtime Start neck and jaw exercise. Use flexeril at HS prn       Relevant Medications   cyclobenzaprine (FLEXERIL) 5 MG tablet   sertraline (ZOLOFT) 50 MG tablet     Other   Generalized anxiety disorder with panic attacks - Primary    Stable mood with zoloft and buspar Has first appt with psychologist today Denies any adverse side effects  Maintain current medication dose F/up in 11months       Relevant Medications   sertraline (ZOLOFT) 50 MG tablet   busPIRone (BUSPAR) 7.5 MG tablet    Follow-up: Return in about 3 months (around 08/23/2021) for anxiety and tension headache.  Alysia Penna, NP

## 2021-06-12 ENCOUNTER — Ambulatory Visit: Payer: Federal, State, Local not specified - PPO | Admitting: Psychologist

## 2021-06-13 ENCOUNTER — Ambulatory Visit: Payer: Federal, State, Local not specified - PPO | Admitting: Family Medicine

## 2021-06-18 ENCOUNTER — Ambulatory Visit: Payer: Federal, State, Local not specified - PPO | Admitting: Family Medicine

## 2021-06-26 ENCOUNTER — Other Ambulatory Visit: Payer: Self-pay | Admitting: Nurse Practitioner

## 2021-06-26 DIAGNOSIS — R1013 Epigastric pain: Secondary | ICD-10-CM

## 2021-06-27 NOTE — Telephone Encounter (Signed)
Chart supports Rx 

## 2021-07-07 ENCOUNTER — Ambulatory Visit (INDEPENDENT_AMBULATORY_CARE_PROVIDER_SITE_OTHER): Payer: Federal, State, Local not specified - PPO | Admitting: Psychologist

## 2021-07-07 DIAGNOSIS — F410 Panic disorder [episodic paroxysmal anxiety] without agoraphobia: Secondary | ICD-10-CM | POA: Diagnosis not present

## 2021-07-07 DIAGNOSIS — F320 Major depressive disorder, single episode, mild: Secondary | ICD-10-CM

## 2021-07-28 ENCOUNTER — Ambulatory Visit (INDEPENDENT_AMBULATORY_CARE_PROVIDER_SITE_OTHER): Payer: Federal, State, Local not specified - PPO | Admitting: Psychologist

## 2021-07-28 DIAGNOSIS — F320 Major depressive disorder, single episode, mild: Secondary | ICD-10-CM

## 2021-07-28 DIAGNOSIS — F410 Panic disorder [episodic paroxysmal anxiety] without agoraphobia: Secondary | ICD-10-CM

## 2021-08-07 ENCOUNTER — Ambulatory Visit (INDEPENDENT_AMBULATORY_CARE_PROVIDER_SITE_OTHER): Payer: Federal, State, Local not specified - PPO | Admitting: Psychologist

## 2021-08-07 DIAGNOSIS — F410 Panic disorder [episodic paroxysmal anxiety] without agoraphobia: Secondary | ICD-10-CM | POA: Diagnosis not present

## 2021-08-07 DIAGNOSIS — F320 Major depressive disorder, single episode, mild: Secondary | ICD-10-CM | POA: Diagnosis not present

## 2021-08-16 ENCOUNTER — Other Ambulatory Visit: Payer: Self-pay | Admitting: Nurse Practitioner

## 2021-08-16 DIAGNOSIS — F411 Generalized anxiety disorder: Secondary | ICD-10-CM

## 2021-08-16 DIAGNOSIS — F410 Panic disorder [episodic paroxysmal anxiety] without agoraphobia: Secondary | ICD-10-CM

## 2021-08-25 ENCOUNTER — Ambulatory Visit: Payer: Federal, State, Local not specified - PPO | Admitting: Psychologist

## 2021-08-29 ENCOUNTER — Other Ambulatory Visit: Payer: Self-pay

## 2021-08-29 ENCOUNTER — Telehealth (INDEPENDENT_AMBULATORY_CARE_PROVIDER_SITE_OTHER): Payer: Federal, State, Local not specified - PPO | Admitting: Nurse Practitioner

## 2021-08-29 ENCOUNTER — Encounter: Payer: Self-pay | Admitting: Nurse Practitioner

## 2021-08-29 VITALS — Ht 71.0 in | Wt 175.0 lb

## 2021-08-29 DIAGNOSIS — F410 Panic disorder [episodic paroxysmal anxiety] without agoraphobia: Secondary | ICD-10-CM | POA: Diagnosis not present

## 2021-08-29 DIAGNOSIS — F411 Generalized anxiety disorder: Secondary | ICD-10-CM

## 2021-08-29 MED ORDER — BUSPIRONE HCL 7.5 MG PO TABS: 7.5000 mg | ORAL_TABLET | Freq: Two times a day (BID) | ORAL | 1 refills | Status: AC

## 2021-08-29 MED ORDER — SERTRALINE HCL 100 MG PO TABS: 100.0000 mg | ORAL_TABLET | Freq: Every day | ORAL | 3 refills | Status: DC

## 2021-08-29 NOTE — Assessment & Plan Note (Signed)
Stable mood, but he thinks it could be better. He still gas bouts of feeling down. These last for about 1week then improve Has maintained sessions with psychologist, next appt in 1week. No Si or hallucination  Increase zoloft to 100mg  Maintain buspar dose and psychology sessions F/up in 4months

## 2021-08-29 NOTE — Progress Notes (Signed)
Virtual Visit via Video Note  I connected withNAME@ on 08/29/21 at 10:30 AM EDT by a video enabled telemedicine application and verified that I am speaking with the correct person using two identifiers.  Location: Patient:Home Provider: Office Participants: patient and provider  I discussed the limitations of evaluation and management by telemedicine and the availability of in person appointments. I also discussed with the patient that there may be a patient responsible charge related to this service. The patient expressed understanding and agreed to proceed.  HE:[id number] and depression f/up  History of Present Illness: Generalized anxiety disorder with panic attacks Stable mood, but he thinks it could be better. He still gas bouts of feeling down. These last for about 1week then improve Has maintained sessions with psychologist, next appt in 1week. No Si or hallucination  Increase zoloft to 100mg  Maintain buspar dose and psychology sessions F/up in 42months    Depression screen [facility] 2/9 08/29/2021 05/23/2021 05/23/2021  Decreased Interest 1 1 0  Down, Depressed, Hopeless 0 1 1  PHQ - 2 Score 1 2 1   Altered sleeping 1 2 -  Tired, decreased energy 3 2 -  Change in appetite 3 2 -  Feeling bad or failure about yourself  0 1 -  Trouble concentrating 1 1 -  Moving slowly or fidgety/restless 0 1 -  Suicidal thoughts 0 0 -  PHQ-9 Score 9 11 -  Difficult doing work/chores Somewhat difficult Not difficult at all -    GAD 7 : Generalized Anxiety Score 08/29/2021 05/23/2021 04/18/2021 02/17/2021  Nervous, Anxious, on Edge 2 1 3 3   Control/stop worrying 1 1 1 2   Worry too much - different things 2 1 2 2   Trouble relaxing 2 1 2 3   Restless 2 1 1 1   Easily annoyed or irritable 1 1 1 3   Afraid - awful might happen 1 1 1 1   Total GAD 7 Score 11 7 11 15   Anxiety Difficulty Somewhat difficult Not difficult at all Somewhat difficult Very difficult   Observations/Objective: Physical  Exam Constitutional:      General: He is not in acute distress. Neurological:     Mental Status: He is alert and oriented to person, place, and time.  Psychiatric:        Mood and Affect: Mood normal.        Behavior: Behavior normal.        Thought Content: Thought content normal.    Assessment and Plan: Danner was seen today for follow-up.  Diagnoses and all orders for this visit:  Generalized anxiety disorder with panic attacks -     busPIRone (BUSPAR) 7.5 MG tablet; Take 1 tablet (7.5 mg total) by mouth 2 (two) times daily. -     sertraline (ZOLOFT) 100 MG tablet; Take 1 tablet (100 mg total) by mouth daily.  Follow Up Instructions: See above   I discussed the assessment and treatment plan with the patient. The patient was provided an opportunity to ask questions and all were answered. The patient agreed with the plan and demonstrated an understanding of the instructions.   The patient was advised to call back or seek an in-person evaluation if the symptoms worsen or if the condition fails to improve as anticipated.  04/19/2021, NP

## 2021-09-25 ENCOUNTER — Other Ambulatory Visit: Payer: Self-pay | Admitting: Nurse Practitioner

## 2021-09-25 DIAGNOSIS — F410 Panic disorder [episodic paroxysmal anxiety] without agoraphobia: Secondary | ICD-10-CM

## 2021-09-25 DIAGNOSIS — G44229 Chronic tension-type headache, not intractable: Secondary | ICD-10-CM

## 2021-12-01 ENCOUNTER — Other Ambulatory Visit: Payer: Self-pay

## 2021-12-01 ENCOUNTER — Ambulatory Visit: Payer: Federal, State, Local not specified - PPO | Admitting: Nurse Practitioner

## 2021-12-18 ENCOUNTER — Encounter: Payer: Self-pay | Admitting: Nurse Practitioner

## 2021-12-18 ENCOUNTER — Ambulatory Visit: Payer: Federal, State, Local not specified - PPO | Admitting: Nurse Practitioner

## 2021-12-19 ENCOUNTER — Ambulatory Visit
Admission: EM | Admit: 2021-12-19 | Discharge: 2021-12-19 | Disposition: A | Discharge status: Home / Self Care | Payer: Federal, State, Local not specified - PPO | Attending: Internal Medicine | Admitting: Internal Medicine

## 2021-12-19 ENCOUNTER — Other Ambulatory Visit: Payer: Self-pay

## 2021-12-19 DIAGNOSIS — K5900 Constipation, unspecified: Secondary | ICD-10-CM | POA: Diagnosis not present

## 2021-12-19 DIAGNOSIS — R1084 Generalized abdominal pain: Secondary | ICD-10-CM

## 2021-12-19 DIAGNOSIS — K219 Gastro-esophageal reflux disease without esophagitis: Secondary | ICD-10-CM

## 2021-12-19 DIAGNOSIS — F411 Generalized anxiety disorder: Secondary | ICD-10-CM | POA: Diagnosis not present

## 2021-12-19 MED ORDER — FAMOTIDINE 20 MG PO TABS: 20.0000 mg | ORAL_TABLET | Freq: Every day | ORAL | 1 refills | Status: DC

## 2021-12-19 NOTE — ED Provider Notes (Signed)
[facility]    CSN: [id number] Arrival date & time: 12/19/21  [id number]      History   Chief Complaint Chief Complaint  Patient presents with   Abdominal Pain    HPI Willie Mcdaniel is a 21 y.o. male with history of anxiety presents to urgent care today with complaints of abdominal discomfort for 2 months with recently worsening constipation and reflux symptoms.  Patient describes occasional nausea with p.o. intake, crampy abdominal pain sometimes making it difficult to take deep breaths.  He reports occasional diarrhea described as loose stool.  He denies any melena or hematochezia, no vomiting, no recent fever or chills.  States over the past couple weeks has had to strain really hard for each bowel movement.  Patient admits to not taking great care of his body with 4-5 red bull drinks per day, frequent marijuana use and vaping nicotine.  He reports only occasional EtOH use.   Past Medical History:  Diagnosis Date   Anxiety     Patient Active Problem List   Diagnosis Date Noted   Chronic tension-type headache, not intractable 05/23/2021   Dyspepsia 02/17/2021   Generalized anxiety disorder with panic attacks 01/16/2021    Past Surgical History:  Procedure Laterality Date   APPENDECTOMY         Home Medications    Prior to Admission medications   Medication Sig Start Date End Date Taking? Authorizing Provider  famotidine (PEPCID) 20 MG tablet Take 1 tablet (20 mg total) by mouth daily. 12/19/21 12/19/22 Yes Rudolpho Sevin, NP  busPIRone (BUSPAR) 7.5 MG tablet Take 1 tablet (7.5 mg total) by mouth 2 (two) times daily. 08/29/21   Nche, Charlene Brooke, NP  cyclobenzaprine (FLEXERIL) 5 MG tablet Take 1-2 tablets (5-10 mg total) by mouth at bedtime as needed for muscle spasms. 05/23/21   Nche, Charlene Brooke, NP  omeprazole (PRILOSEC) 20 MG capsule TAKE 1 CAPSULE(20 MG) BY MOUTH DAILY 06/27/21   Nche, Charlene Brooke, NP  polyethylene glycol powder (GLYCOLAX/MIRALAX) 17 GM/SCOOP  powder Take 17 g by mouth daily. 01/16/21   Nche, Charlene Brooke, NP  sertraline (ZOLOFT) 100 MG tablet Take 1 tablet (100 mg total) by mouth daily. 08/29/21   Nche, Charlene Brooke, NP    Family History Family History  Problem Relation Age of Onset   Hypertension Mother    Hypertension Father    Hyperlipidemia Father    Anxiety disorder Father    Depression Father    Alcohol abuse Father    Migraines Father    Depression Paternal Aunt    Alcohol abuse Paternal Uncle    Depression Paternal Uncle     Social History Social History   Tobacco Use   Smoking status: Never   Smokeless tobacco: Never  Vaping Use   Vaping Use: Every day  Substance Use Topics   Alcohol use: Never   Drug use: Yes    Types: Marijuana     Allergies   Patient has no known allergies.   Review of Systems As stated in HPI otherwise negative   Physical Exam Triage Vital Signs ED Triage Vitals  Enc Vitals Group     BP 12/19/21 0901 138/82     Pulse Rate 12/19/21 0901 82     Resp 12/19/21 0901 16     Temp 12/19/21 0901 98.9 F (37.2 C)     Temp Source 12/19/21 0901 Oral     SpO2 12/19/21 0901 98 %     Weight --  Height --      Head Circumference --      Peak Flow --      Pain Score 12/19/21 0858 8     Pain Loc --      Pain Edu? --      Excl. in [place]? --    No data found.  Updated Vital Signs BP 138/82 (BP Location: Right Arm)    Pulse 82    Temp 98.9 F (37.2 C) (Oral)    Resp 16    SpO2 98%   Visual Acuity Right Eye Distance:   Left Eye Distance:   Bilateral Distance:    Right Eye Near:   Left Eye Near:    Bilateral Near:     Physical Exam Constitutional:      General: He is not in acute distress.    Appearance: He is not ill-appearing or toxic-appearing.     Comments: Thin  HENT:     Mouth/Throat:     Mouth: Mucous membranes are moist.     Pharynx: Oropharynx is clear. No pharyngeal swelling.  Eyes:     General: No scleral icterus.    Extraocular Movements:  Extraocular movements intact.  Cardiovascular:     Rate and Rhythm: Normal rate and regular rhythm.     Heart sounds: No murmur heard.   No friction rub. No gallop.  Pulmonary:     Effort: Pulmonary effort is normal.     Breath sounds: Normal breath sounds. No wheezing, rhonchi or rales.  Abdominal:     General: Abdomen is flat. Bowel sounds are normal.     Palpations: Abdomen is soft.     Tenderness: There is no abdominal tenderness. There is no right CVA tenderness, left CVA tenderness or guarding. Negative signs include Murphy's sign and McBurney's sign.     Hernia: No hernia is present.  Skin:    General: Skin is warm and dry.  Neurological:     General: No focal deficit present.     Mental Status: He is alert.  Psychiatric:        Mood and Affect: Mood normal.        Behavior: Behavior normal.     UC Treatments / Results  Labs (all labs ordered are listed, but only abnormal results are displayed) Labs Reviewed - No data to display  EKG   Radiology No results found.  Procedures Procedures (including critical care time)  Medications Ordered in UC Medications - No data to display  Initial Impression / Assessment and Plan / UC Course  I have reviewed the triage vital signs and the nursing notes.  Pertinent labs & imaging results that were available during my care of the patient were reviewed by me and considered in my medical decision making (see chart for details).  Generalized abdominal pain -Symptoms ongoing for approximately 2 months, VSS and nontoxic-appearing -Exam is reassuring with no abnormal findings and nothing to suggest acute abdomen -Symptoms appear to be multifactorial in nature with acid reflux in addition to constipation likely exacerbated by lifestyle choices -MiraLAX 17 g daily then as needed, increase dietary fiber -Pepcid 20 mg p.o. daily -Long discussion regarding lifestyle modifications helping to alleviate symptoms as well as alleviating  longstanding anxiety  Reviewed expections re: course of current medical issues. Questions answered. Outlined signs and symptoms indicating need for more acute intervention. Pt verbalized understanding. AVS given  Final Clinical Impressions(s) / UC Diagnoses   Final diagnoses:  Generalized abdominal pain  Gastroesophageal  reflux disease, unspecified whether esophagitis present  Constipation, unspecified constipation type  Anxiety state     Discharge Instructions      As we discussed, I feel like your abdominal pain is likely due to acid reflux as well as constipation.  Please be mindful of what you are eating and drinking and avoid red bulls altogether as this is also not helping her anxiety.  Take prescribed antacid daily.  MiraLAX 17 g daily until bowel movement and then use as needed.  Increase dietary fiber and water intake.  Please follow-up with your nurse practitioner as previously scheduled at the end of this month     ED Prescriptions     Medication Sig Dispense Auth. Provider   famotidine (PEPCID) 20 MG tablet Take 1 tablet (20 mg total) by mouth daily. 30 tablet Rudolpho Sevin, NP      PDMP not reviewed this encounter.   Rudolpho Sevin, NP 12/19/21 1759

## 2021-12-19 NOTE — ED Triage Notes (Signed)
Pt c/o abd pain (described as a squeezing sensation), n/v after attempting to eat, difficulty breathing at times. Patient states he feels like he has trapped gas and has irregular bowel movements. Patient reports drinking 3-4 red bulls a day.

## 2021-12-19 NOTE — Discharge Instructions (Addendum)
As we discussed, I feel like your abdominal pain is likely due to acid reflux as well as constipation.  Please be mindful of what you are eating and drinking and avoid red bulls altogether as this is also not helping her anxiety.  Take prescribed antacid daily.  MiraLAX 17 g daily until bowel movement and then use as needed.  Increase dietary fiber and water intake.  Please follow-up with your nurse practitioner as previously scheduled at the end of this month

## 2021-12-30 ENCOUNTER — Other Ambulatory Visit: Payer: Self-pay | Admitting: Nurse Practitioner

## 2021-12-30 DIAGNOSIS — R1013 Epigastric pain: Secondary | ICD-10-CM

## 2022-01-07 NOTE — Telephone Encounter (Signed)
Chart supports Rx  Next OV 01/13/22

## 2022-01-13 ENCOUNTER — Ambulatory Visit: Payer: Federal, State, Local not specified - PPO | Admitting: Nurse Practitioner

## 2022-01-19 ENCOUNTER — Ambulatory Visit: Payer: Federal, State, Local not specified - PPO | Admitting: Nurse Practitioner

## 2022-01-19 ENCOUNTER — Encounter: Payer: Self-pay | Admitting: Nurse Practitioner

## 2022-01-19 ENCOUNTER — Telehealth: Payer: Self-pay | Admitting: Nurse Practitioner

## 2022-01-19 ENCOUNTER — Other Ambulatory Visit: Payer: Self-pay

## 2022-01-19 VITALS — BP 134/94 | HR 99 | Temp 98.3°F | Wt 149.8 lb

## 2022-01-19 DIAGNOSIS — R195 Other fecal abnormalities: Secondary | ICD-10-CM | POA: Diagnosis not present

## 2022-01-19 DIAGNOSIS — K561 Intussusception: Secondary | ICD-10-CM

## 2022-01-19 DIAGNOSIS — J019 Acute sinusitis, unspecified: Secondary | ICD-10-CM | POA: Diagnosis not present

## 2022-01-19 DIAGNOSIS — R1030 Lower abdominal pain, unspecified: Secondary | ICD-10-CM | POA: Diagnosis not present

## 2022-01-19 DIAGNOSIS — R109 Unspecified abdominal pain: Secondary | ICD-10-CM | POA: Insufficient documentation

## 2022-01-19 LAB — POCT URINALYSIS DIPSTICK
Bilirubin, UA: NEGATIVE
Blood, UA: NEGATIVE
Glucose, UA: NEGATIVE
Ketones, UA: 5
Nitrite, UA: NEGATIVE
Protein, UA: NEGATIVE
Spec Grav, UA: 1.015 (ref 1.010–1.025)
Urobilinogen, UA: 1 E.U./dL
pH, UA: 7 (ref 5.0–8.0)

## 2022-01-19 MED ORDER — DOXYCYCLINE HYCLATE 100 MG PO TABS: 100.0000 mg | ORAL_TABLET | Freq: Two times a day (BID) | ORAL | 0 refills | Status: DC

## 2022-01-19 NOTE — Telephone Encounter (Signed)
Pt has missed 3 appts 12/19, 1/5, and 1/31 - I spoke to pt today about policy and advised additional missed visits would result in dismissal. Letter also sent today via MyChart.

## 2022-01-19 NOTE — Patient Instructions (Signed)
It was great to see you!  Start doxycyline 1 tablet twice a day. Make sure you are drinking plenty of fluids  We are going to check some blood work today and a urine sample. Bring back a stool sample so we can test it for blood. We are also going to schedule a CT of your stomach. They will call to schedule this.  Let's follow-up in 4 weeks, sooner if you have concerns.  If a referral was placed today, you will be contacted for an appointment. Please note that routine referrals can sometimes take up to 3-4 weeks to process. Please call our office if you haven't heard anything after this time frame.  Take care,  Vance Peper, NP

## 2022-01-19 NOTE — Progress Notes (Signed)
Acute Office Visit  Subjective:    Patient ID: Willie Mcdaniel, male    DOB: 2001-06-25, 21 y.o.   MRN: [id number]  Chief Complaint  Patient presents with   Cough    Pt c/o cough w/ dark brown mucous and black specks, hot flashes, stomach pains, nasal congestion, and nausea x1 week w/ chronic constipation    HPI Patient is in today for stomach pain for the past few months associated with constipation. He has taken miralax, omeprzole, and famotidine which is not helping his symptoms. Pain tends to be in the lower abdomen bilaterally. He also has been having intermittent constipation and diarrhea. He notes that his stools have been darker in color the past week. He denies fevers and chest pain.   Willie Mcdaniel also noted a cough that started a few weeks ago, recently started becoming painful when coughing. He coughed up brown sputum today.    UPPER RESPIRATORY TRACT INFECTION  Fever: no, however having chills Cough: yes Shortness of breath: no Wheezing: no Chest pain: yes, with cough Chest tightness: no Chest congestion: yes Nasal congestion: yes Runny nose: yes Post nasal drip: no Sneezing: yes Sore throat: yes Swollen glands: yes Sinus pressure: yes Headache: yes Face pain: no Toothache: no Ear pain: yes bilateral Ear pressure: no bilateral Eyes red/itching:no Eye drainage/crusting: no  Vomiting: yes Rash: no Fatigue: yes Sick contacts: no Strep contacts: no  Context: stable Recurrent sinusitis: no Relief with OTC cold/cough medications:  n/a   Treatments attempted: none    Past Medical History:  Diagnosis Date   Anxiety     Past Surgical History:  Procedure Laterality Date   APPENDECTOMY      Family History  Problem Relation Age of Onset   Hypertension Mother    Hypertension Father    Hyperlipidemia Father    Anxiety disorder Father    Depression Father    Alcohol abuse Father    Migraines Father    Depression Paternal Aunt    Alcohol abuse Paternal  Uncle    Depression Paternal Uncle     Social History   Socioeconomic History   Marital status: Single    Spouse name: Not on file   Number of children: Not on file   Years of education: Not on file   Highest education level: Not on file  Occupational History   Not on file  Tobacco Use   Smoking status: Never   Smokeless tobacco: Never  Vaping Use   Vaping Use: Every day  Substance and Sexual Activity   Alcohol use: Never   Drug use: Yes    Types: Marijuana   Sexual activity: Not Currently  Other Topics Concern   Not on file  Social History Narrative   Not on file   Social Determinants of Health   Financial Resource Strain: Not on file  Food Insecurity: Not on file  Transportation Needs: Not on file  Physical Activity: Not on file  Stress: Not on file  Social Connections: Not on file  Intimate Partner Violence: Not on file    Outpatient Medications Prior to Visit  Medication Sig Dispense Refill   busPIRone (BUSPAR) 7.5 MG tablet Take 1 tablet (7.5 mg total) by mouth 2 (two) times daily. 180 tablet 1   cyclobenzaprine (FLEXERIL) 5 MG tablet Take 1-2 tablets (5-10 mg total) by mouth at bedtime as needed for muscle spasms. 30 tablet 1   famotidine (PEPCID) 20 MG tablet Take 1 tablet (20 mg total) by mouth  daily. 30 tablet 1   omeprazole (PRILOSEC) 20 MG capsule TAKE 1 CAPSULE(20 MG) BY MOUTH DAILY 30 capsule 5   polyethylene glycol powder (GLYCOLAX/MIRALAX) 17 GM/SCOOP powder Take 17 g by mouth daily. 3350 g 1   sertraline (ZOLOFT) 100 MG tablet Take 1 tablet (100 mg total) by mouth daily. 90 tablet 3   No facility-administered medications prior to visit.    No Known Allergies  Review of Systems See pertinent positives and negatives per HPI.    Objective:    Physical Exam Vitals and nursing note reviewed.  Constitutional:      Appearance: Normal appearance.  HENT:     Head: Normocephalic.     Right Ear: Tympanic membrane, ear canal and external ear  normal.     Left Ear: Tympanic membrane, ear canal and external ear normal.  Eyes:     Conjunctiva/sclera: Conjunctivae normal.  Cardiovascular:     Rate and Rhythm: Normal rate and regular rhythm.     Pulses: Normal pulses.     Heart sounds: Normal heart sounds.  Pulmonary:     Effort: Pulmonary effort is normal.     Breath sounds: Normal breath sounds.  Abdominal:     Palpations: Abdomen is soft.     Tenderness: There is abdominal tenderness (LLQ).  Musculoskeletal:     Cervical back: Normal range of motion.  Skin:    General: Skin is warm.  Neurological:     General: No focal deficit present.     Mental Status: He is alert and oriented to person, place, and time.  Psychiatric:        Mood and Affect: Mood normal.        Behavior: Behavior normal.        Thought Content: Thought content normal.        Judgment: Judgment normal.    BP (!) 134/94    Pulse 99    Temp 98.3 F (36.8 C) (Oral)    Wt 149 lb 12.8 oz (67.9 kg)    SpO2 99%    BMI 20.89 kg/m  Wt Readings from Last 3 Encounters:  01/19/22 149 lb 12.8 oz (67.9 kg)  08/29/21 175 lb (79.4 kg)  05/23/21 172 lb (78 kg)    Health Maintenance Due  Topic Date Due   HPV VACCINES (1 - Male 2-dose series) Never done   TETANUS/TDAP  Never done   COVID-19 Vaccine (3 - Booster for Moderna series) 03/05/2021   INFLUENZA VACCINE  Never done       Topic Date Due   HPV VACCINES (1 - Male 2-dose series) Never done     Lab Results  Component Value Date   TSH 4.68 04/18/2021   Lab Results  Component Value Date   WBC 8.2 04/18/2021   HGB 16.2 04/18/2021   HCT 45.7 04/18/2021   MCV 83.0 04/18/2021   PLT 193.0 04/18/2021   Lab Results  Component Value Date   NA 140 04/18/2021   K 3.8 04/18/2021   CO2 30 04/18/2021   GLUCOSE 87 04/18/2021   BUN 26 (H) 04/18/2021   CREATININE 1.19 04/18/2021   BILITOT 0.5 04/18/2021   ALKPHOS 78 04/18/2021   AST 30 04/18/2021   ALT 19 04/18/2021   PROT 7.3 04/18/2021   ALBUMIN  4.8 04/18/2021   CALCIUM 10.0 04/18/2021   GFR 88.10 04/18/2021   No results found for: CHOL No results found for: HDL No results found for: LDLCALC No results found for: TRIG  No results found for: CHOLHDL No results found for: HGBA1C     Assessment & Plan:   Problem List Items Addressed This Visit       Other   Lower abdominal pain - Primary    Chronic, ongoing for at least 3 months. No red flags on exam. Has a history of appendectomy. U/A negative. Will check CMP, CBC today. With ongoing symptoms, will check CT abdomen. Continue pepcid, omeprazole, and miralax prn. Consider referral to GI if symptoms ongoing. Follow up with PCP in 4 weeks.       Relevant Orders   CT ABDOMEN PELVIS W WO CONTRAST   CBC with Differential/Platelet   Comprehensive metabolic panel   POCT urinalysis dipstick (Completed)   Hemoccult Cards (X3 cards)   Other Visit Diagnoses     Acute non-recurrent sinusitis, unspecified location       Start doxycycline BID x10 days. Increase fluids, rest. Can take OTC mucinex prn.    Relevant Medications   doxycycline (VIBRA-TABS) 100 MG tablet   Dark stools       Check CBC and hemoccult. See plan for abdominal pain   Relevant Orders   Hemoccult Cards (X3 cards)       Meds ordered this encounter  Medications   doxycycline (VIBRA-TABS) 100 MG tablet    Sig: Take 1 tablet (100 mg total) by mouth 2 (two) times daily.    Dispense:  20 tablet    Refill:  0    Charyl Dancer, NP

## 2022-01-19 NOTE — Assessment & Plan Note (Addendum)
Chronic, ongoing for at least 3 months. No red flags on exam. Has a history of appendectomy. U/A negative. Will check CMP, CBC today. With ongoing symptoms, will check CT abdomen. Continue pepcid, omeprazole, and miralax prn. Consider referral to GI if symptoms ongoing. Follow up with PCP in 4 weeks.

## 2022-01-20 LAB — CBC WITH DIFFERENTIAL/PLATELET
Basophils Absolute: 0.1 10*3/uL (ref 0.0–0.1)
Basophils Relative: 0.9 % (ref 0.0–3.0)
Eosinophils Absolute: 0.2 10*3/uL (ref 0.0–0.7)
Eosinophils Relative: 2.1 % (ref 0.0–5.0)
HCT: 48.8 % (ref 39.0–52.0)
Hemoglobin: 16.5 g/dL (ref 13.0–17.0)
Lymphocytes Relative: 16 % (ref 12.0–46.0)
Lymphs Abs: 1.3 10*3/uL (ref 0.7–4.0)
MCHC: 33.8 g/dL (ref 30.0–36.0)
MCV: 88.3 fl (ref 78.0–100.0)
Monocytes Absolute: 1 10*3/uL (ref 0.1–1.0)
Monocytes Relative: 12.8 % — ABNORMAL HIGH (ref 3.0–12.0)
Neutro Abs: 5.4 10*3/uL (ref 1.4–7.7)
Neutrophils Relative %: 68.2 % (ref 43.0–77.0)
Platelets: 201 10*3/uL (ref 150.0–400.0)
RBC: 5.53 Mil/uL (ref 4.22–5.81)
RDW: 12.8 % (ref 11.5–14.6)
WBC: 7.9 10*3/uL (ref 4.5–10.5)

## 2022-01-20 LAB — COMPREHENSIVE METABOLIC PANEL
ALT: 15 U/L (ref 0–53)
AST: 25 U/L (ref 0–37)
Albumin: 4.9 g/dL (ref 3.5–5.2)
Alkaline Phosphatase: 83 U/L (ref 39–117)
BUN: 13 mg/dL (ref 6–23)
CO2: 31 mEq/L (ref 19–32)
Calcium: 9.7 mg/dL (ref 8.4–10.5)
Chloride: 101 mEq/L (ref 96–112)
Creatinine, Ser: 0.89 mg/dL (ref 0.40–1.50)
GFR: 122.95 mL/min (ref >60.00)
Glucose, Bld: 83 mg/dL (ref 70–99)
Potassium: 3.5 mEq/L (ref 3.5–5.1)
Sodium: 139 mEq/L (ref 135–145)
Total Bilirubin: 0.5 mg/dL (ref 0.2–1.2)
Total Protein: 7.7 g/dL (ref 6.0–8.3)

## 2022-01-23 ENCOUNTER — Other Ambulatory Visit: Payer: Self-pay

## 2022-01-23 ENCOUNTER — Other Ambulatory Visit: Payer: Federal, State, Local not specified - PPO

## 2022-01-23 NOTE — Progress Notes (Signed)
Per the orders of Rodman Pickle NP pt is here to drop off hemocult cards.

## 2022-02-12 ENCOUNTER — Ambulatory Visit
Admission: RE | Admit: 2022-02-12 | Discharge: 2022-02-12 | Disposition: A | Discharge status: Home / Self Care | Payer: Federal, State, Local not specified - PPO | Source: Ambulatory Visit | Attending: Nurse Practitioner | Admitting: Nurse Practitioner

## 2022-02-12 ENCOUNTER — Other Ambulatory Visit: Payer: Self-pay

## 2022-02-12 DIAGNOSIS — R1030 Lower abdominal pain, unspecified: Secondary | ICD-10-CM

## 2022-02-12 DIAGNOSIS — K561 Intussusception: Secondary | ICD-10-CM | POA: Diagnosis not present

## 2022-02-12 MED ORDER — IOPAMIDOL (ISOVUE-300) INJECTION 61%
100.0000 mL | Freq: Once | INTRAVENOUS | Status: AC | PRN
  Administered 2022-02-12: 100 mL via INTRAVENOUS

## 2022-02-13 NOTE — Addendum Note (Signed)
Addended by: Vance Peper A on: 02/13/2022 12:03 PM   Modules accepted: Orders

## 2022-02-17 NOTE — Progress Notes (Signed)
Called and left detailed vm. Sw, cma

## 2022-02-18 ENCOUNTER — Encounter: Payer: Self-pay | Admitting: Nurse Practitioner

## 2022-02-25 ENCOUNTER — Encounter: Payer: Self-pay | Admitting: Gastroenterology

## 2022-03-04 ENCOUNTER — Other Ambulatory Visit: Payer: Self-pay

## 2022-03-04 ENCOUNTER — Encounter: Payer: Self-pay | Admitting: Nurse Practitioner

## 2022-03-04 ENCOUNTER — Ambulatory Visit (INDEPENDENT_AMBULATORY_CARE_PROVIDER_SITE_OTHER): Payer: Federal, State, Local not specified - PPO | Admitting: Nurse Practitioner

## 2022-03-04 VITALS — BP 124/68 | HR 96 | Temp 98.3°F | Ht 71.0 in | Wt 160.2 lb

## 2022-03-04 DIAGNOSIS — R1084 Generalized abdominal pain: Secondary | ICD-10-CM

## 2022-03-04 DIAGNOSIS — K561 Intussusception: Secondary | ICD-10-CM | POA: Diagnosis not present

## 2022-03-04 DIAGNOSIS — R1030 Lower abdominal pain, unspecified: Secondary | ICD-10-CM

## 2022-03-04 DIAGNOSIS — K5900 Constipation, unspecified: Secondary | ICD-10-CM

## 2022-03-04 MED ORDER — POLYETHYLENE GLYCOL 3350 17 GM/SCOOP PO POWD: 17.0000 g | Freq: Every day | ORAL | 5 refills | Status: AC

## 2022-03-04 NOTE — Assessment & Plan Note (Addendum)
Persistent ABD pain, generalized, chronic, waxing and waning. ?No nausea, no vomiting. ?Reports small BM once a day. He did not take miralax as recommended. ?CT ABD/pelvis done 12/2021: "Two left abdominal and upper pelvic small bowel intussusceptions.These may be transient and incidental, but given the patient's left side abdominal pain and lack of other explanation, this could be followed with repeat CT in 1-2 months to ensure resolution. No bowel obstruction." ? ?No signs of an acute abdomen today. We discussed previous CT findings, need to use miralax daily and repeat CT. He verbalized understanding. ?

## 2022-03-04 NOTE — Patient Instructions (Addendum)
Start miralax 17g once a day ?Maintain high fiber diet and adequate oral hydration ?You will be contacted to schedule CT ABD ?Maintain appt with GI ?Go to ED if you develop worsening pain, nausea and vomiting. ? ?

## 2022-03-04 NOTE — Progress Notes (Signed)
? ?             Established Patient Visit ? ?Patient: Willie Mcdaniel   DOB: 2001/08/19   21 y.o. Male  MRN: [id number] ?Visit Date: 03/04/2022 ? ?Subjective:  ?  ?Chief Complaint  ?Patient presents with  ? Follow-up  ?  1 month f/u regarding abdominal pain, pt states he is still having pain.  ?Pt states he is scheduled to see the GI doctor on 03/18/22  ? ?HPI ?Abdominal pain ?Persistent ABD pain, generalized, chronic, waxing and waning. ?No nausea, no vomiting. ?Reports small BM once a day. He did not take miralax as recommended. ?CT ABD/pelvis done 12/2021: "Two left abdominal and upper pelvic small bowel intussusceptions.These may be transient and incidental, but given the patient's left side abdominal pain and lack of other explanation, this could be followed with repeat CT in 1-2 months to ensure resolution. No bowel obstruction." ? ?No signs of an acute abdomen today. We discussed previous CT findings, need to use miralax daily and repeat CT. He verbalized understanding. ? ?Reviewed medical, surgical, and social history today ? ?Medications: ?Outpatient Medications Prior to Visit  ?Medication Sig Note  ? busPIRone (BUSPAR) 7.5 MG tablet Take 1 tablet (7.5 mg total) by mouth 2 (two) times daily.   ? doxycycline (VIBRA-TABS) 100 MG tablet Take 1 tablet (100 mg total) by mouth 2 (two) times daily.   ? omeprazole (PRILOSEC) 20 MG capsule TAKE 1 CAPSULE(20 MG) BY MOUTH DAILY   ? sertraline (ZOLOFT) 100 MG tablet Take 1 tablet (100 mg total) by mouth daily.   ? [DISCONTINUED] polyethylene glycol powder (GLYCOLAX/MIRALAX) 17 GM/SCOOP powder Take 17 g by mouth daily. 03/04/2022: PRN  ? [DISCONTINUED] cyclobenzaprine (FLEXERIL) 5 MG tablet Take 1-2 tablets (5-10 mg total) by mouth at bedtime as needed for muscle spasms. (Patient not taking: Reported on 03/04/2022)   ? [DISCONTINUED] famotidine (PEPCID) 20 MG tablet Take 1 tablet (20 mg total) by mouth daily. (Patient not taking: Reported on 03/04/2022)   ? ?No  facility-administered medications prior to visit.  ? ?Reviewed past medical and social history.  ? ?ROS per HPI above ? ? ?   ?Objective:  ?BP 124/68 (BP Location: Left Arm, Patient Position: Sitting, Cuff Size: Large)   Pulse 96   Temp 98.3 ?F (36.8 ?C) (Temporal)   Ht 5\' 11"  (1.803 m)   Wt 160 lb 3.2 oz (72.7 kg)   SpO2 99%   BMI 22.34 kg/m?  ? ?  ? ?Physical Exam ?Constitutional:   ?   General: He is not in acute distress. ?Abdominal:  ?   General: Bowel sounds are normal. There is no distension.  ?   Palpations: Abdomen is soft. There is no mass.  ?   Tenderness: There is generalized abdominal tenderness. There is no right CVA tenderness, left CVA tenderness or guarding.  ?   Hernia: No hernia is present.  ?Neurological:  ?   Mental Status: He is alert.  ?  ?No results found for any visits on 03/04/22. ?   ?Assessment & Plan:  ?  ?Problem List Items Addressed This Visit   ? ?  ? Other  ? Abdominal pain  ?  Persistent ABD pain, generalized, chronic, waxing and waning. ?No nausea, no vomiting. ?Reports small BM once a day. He did not take miralax as recommended. ?CT ABD/pelvis done 12/2021: "Two left abdominal and upper pelvic small bowel intussusceptions.These may be transient and incidental, but given the patient's left side abdominal pain  and lack of other explanation, this could be followed with repeat CT in 1-2 months to ensure resolution. No bowel obstruction." ? ?No signs of an acute abdomen today. We discussed previous CT findings, need to use miralax daily and repeat CT. He verbalized understanding. ?  ?  ? Relevant Orders  ? CT ABDOMEN PELVIS W CONTRAST  ? ?Other Visit Diagnoses   ? ? Intussusception of small intestine ([place])    -  Primary  ? Relevant Orders  ? CT ABDOMEN PELVIS W CONTRAST  ? Constipation, unspecified constipation type      ? Relevant Medications  ? polyethylene glycol powder (GLYCOLAX/MIRALAX) 17 GM/SCOOP powder  ? ?  ? ?Return in about 3 months (around 06/04/2022) for CPE  (fasting). ? ?  ? ?Wilfred Lacy, NP ? ? ?

## 2022-03-18 ENCOUNTER — Ambulatory Visit (INDEPENDENT_AMBULATORY_CARE_PROVIDER_SITE_OTHER): Payer: Federal, State, Local not specified - PPO | Admitting: Gastroenterology

## 2022-03-18 ENCOUNTER — Encounter: Payer: Self-pay | Admitting: Gastroenterology

## 2022-03-18 VITALS — BP 110/70 | HR 72 | Ht 71.0 in | Wt 156.0 lb

## 2022-03-18 DIAGNOSIS — K5900 Constipation, unspecified: Secondary | ICD-10-CM

## 2022-03-18 DIAGNOSIS — R1013 Epigastric pain: Secondary | ICD-10-CM | POA: Diagnosis not present

## 2022-03-18 DIAGNOSIS — R112 Nausea with vomiting, unspecified: Secondary | ICD-10-CM | POA: Diagnosis not present

## 2022-03-18 MED ORDER — GLYCOPYRROLATE 1 MG PO TABS: 1.0000 mg | ORAL_TABLET | Freq: Two times a day (BID) | ORAL | 11 refills | Status: DC

## 2022-03-18 NOTE — Progress Notes (Signed)
? ? ?History of Present Illness: This is a 21 year old male referred by Willie Mcdaniel, Willie Gains, NP for the evaluation of abdominal pain and an abnormal CT scan.  He relates a several months of intermittent nausea and vomiting, decreased appetite, 10 to 12 pound weight loss, epigastric pain and constipation.  He states he has been frequently using marijuana, ecstasy and intermittently using cocaine.  He is under treatment for anxiety.  He relates a difficult time with bowel movements with straining and hard stools.  He was prescribed MiraLAX but states he does not take it daily and sometimes does not remember to take it.  He has been prescribed omeprazole which does not appear to be helping his symptoms.  CMP, CBC were unremarkable.  CT AP below. Denies diarrhea, change in stool caliber, melena, hematochezia, nausea, vomiting, dysphagia, reflux symptoms, chest pain. ? ? ?CT AP 02/12/2022 ?Two left abdominal and upper pelvic small bowel intussusceptions. ?These may be transient and incidental, but given the patient's left ?side abdominal pain and lack of other explanation, this could be ?followed with repeat CT in 1-2 months to ensure resolution. No bowel ?obstruction. ? ? ?No Known Allergies ?Outpatient Medications Prior to Visit  ?Medication Sig Dispense Refill  ? polyethylene glycol powder (GLYCOLAX/MIRALAX) 17 GM/SCOOP powder Take 17 g by mouth daily. 507 g 5  ? sertraline (ZOLOFT) 100 MG tablet Take 1 tablet (100 mg total) by mouth daily. 90 tablet 3  ? busPIRone (BUSPAR) 7.5 MG tablet Take 1 tablet (7.5 mg total) by mouth 2 (two) times daily. (Patient not taking: Reported on 03/18/2022) 180 tablet 1  ? omeprazole (PRILOSEC) 20 MG capsule TAKE 1 CAPSULE(20 MG) BY MOUTH DAILY (Patient not taking: Reported on 03/18/2022) 30 capsule 5  ? doxycycline (VIBRA-TABS) 100 MG tablet Take 1 tablet (100 mg total) by mouth 2 (two) times daily. 20 tablet 0  ? ?No facility-administered medications prior to visit.  ? ?Past Medical  History:  ?Diagnosis Date  ? Anxiety   ? ?Past Surgical History:  ?Procedure Laterality Date  ? APPENDECTOMY    ? ?Social History  ? ?Socioeconomic History  ? Marital status: Single  ?  Spouse name: Not on file  ? Number of children: Not on file  ? Years of education: Not on file  ? Highest education level: Not on file  ?Occupational History  ? Not on file  ?Tobacco Use  ? Smoking status: Never  ? Smokeless tobacco: Never  ?Vaping Use  ? Vaping Use: Every day  ?Substance and Sexual Activity  ? Alcohol use: Never  ? Drug use: Yes  ?  Types: Marijuana  ? Sexual activity: Not Currently  ?Other Topics Concern  ? Not on file  ?Social History Narrative  ? Not on file  ? ?Social Determinants of Health  ? ?Financial Resource Strain: Not on file  ?Food Insecurity: Not on file  ?Transportation Needs: Not on file  ?Physical Activity: Not on file  ?Stress: Not on file  ?Social Connections: Not on file  ? ?Family History  ?Problem Relation Age of Onset  ? Hypertension Mother   ? Hypertension Father   ? Hyperlipidemia Father   ? Anxiety disorder Father   ? Depression Father   ? Alcohol abuse Father   ? Migraines Father   ? Depression Paternal Aunt   ? Alcohol abuse Paternal Uncle   ? Depression Paternal Uncle   ? ?  ? ?Review of Systems: Pertinent positive and negative review of systems were noted  in the above HPI section. All other review of systems were otherwise negative. ? ? ? ?Physical Exam: ?General: Well developed, well nourished, no acute distress ?Head: Normocephalic and atraumatic ?Eyes: Sclerae anicteric, EOMI ?Ears: Normal auditory acuity ?Mouth: Not examined, mask on during Covid-19 pandemic ?Neck: Supple, no masses or thyromegaly ?Lungs: Clear throughout to auscultation ?Heart: Regular rate and rhythm; no murmurs, rubs or bruits ?Abdomen: Soft, non tender and non distended. No masses, hepatosplenomegaly or hernias noted. Normal Bowel sounds ?Rectal: not done ?Musculoskeletal: Symmetrical with no gross deformities   ?Skin: No lesions on visible extremities ?Pulses:  Normal pulses noted ?Extremities: No clubbing, cyanosis, edema or deformities noted ?Neurological: Alert oriented x 4, grossly nonfocal ?Cervical Nodes:  No significant cervical adenopathy ?Inguinal Nodes: No significant inguinal adenopathy ?Psychological:  Alert and cooperative. Normal mood and affect ? ? ?Assessment and Recommendations: ? ?Epigastric pain, constipation, nausea, vomiting, decreased appetite, weight loss, abnormal CT scan.  Intussusception is likely an innocent finding without clinical significance however recommend repeat CT scan as ordered to further evaluate.  CT enterography may be indicated for further evaluation pending follow-up CT findings.  He is advised to discontinue all illicit drug use and seek help for managing this issue.  Begin MiraLAX once or twice daily titrated for a complete normal bowel movement daily.  Begin glycopyrrolate 1 mg p.o. twice daily.  Continue omeprazole 20 mg p.o. daily.  Consider further evaluation with EGD and colonoscopy pending response to above. REV in 1 month. ? ? ?cc: Willie Mcdaniel, Willie Gains, NP ?[phone] ?[place],  [place] [address] ?

## 2022-03-18 NOTE — Patient Instructions (Addendum)
We have sent the following medications to your pharmacy for you to pick up at your convenience: glycopyrrolate.  ? ?Take your Miralax 1-2 scoops daily to titrate depending on your bowel movements.  ? ?Stop all illicit drug use.  ? ?Call [facility] at [phone] to schedule your CT scan that was ordered by your primary care physician. We would like to see you after you have had the CT scan completed.  ? ?The [facility] providers would like to encourage you to use [facility] to communicate with providers for non-urgent requests or questions.  Due to long hold times on the telephone, sending your provider a message by [facility] may be a faster and more efficient way to get a response.  Please allow 48 business hours for a response.  Please remember that this is for non-urgent requests.  ? ?Thank you for choosing me and [facility]. ? ?Malcolm T. Pleas Koch., MD., [facility] ? ?

## 2022-04-28 ENCOUNTER — Ambulatory Visit: Payer: Federal, State, Local not specified - PPO | Admitting: Gastroenterology

## 2022-05-28 IMAGING — CT CT ABD-PELV W/ CM
1 of 2 series · 14 of 32 positions shown, 19 images · IV contrast (APPLIED)
Comparison: None.

CLINICAL DATA: Left lower quadrant abdominal pain

EXAM:
CT ABDOMEN AND PELVIS WITH CONTRAST
TECHNIQUE: Multidetector CT imaging of the abdomen and pelvis was performed
using the standard protocol following bolus administration of
intravenous contrast.

[Series 2: abd/pelvis w/cm · axial · 0.68mm/px · z∈[-584,-169]mm · 14 of 95 slices shown, 19 images]
[im 6/95  soft-tissue]
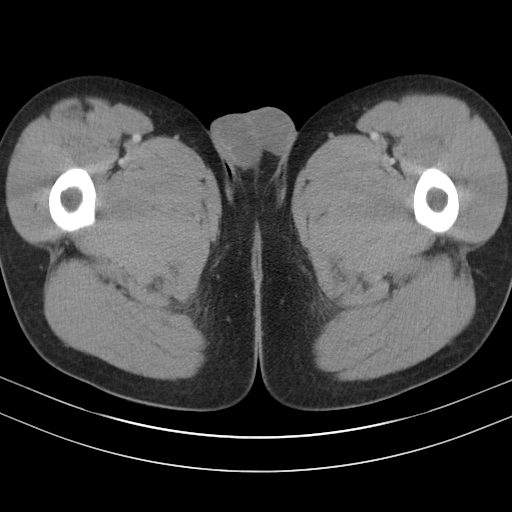
[im 6/95  bone]
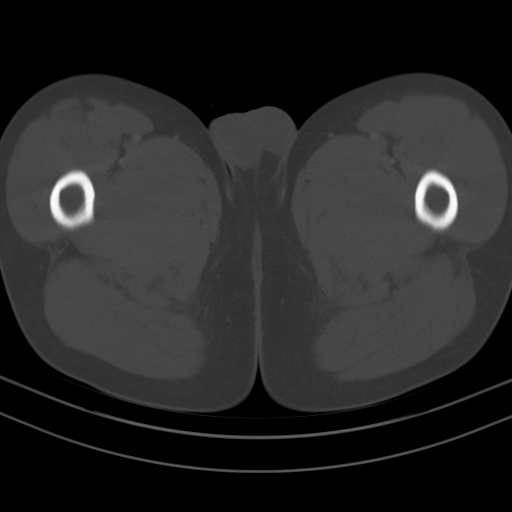
[im 11/95  soft-tissue]
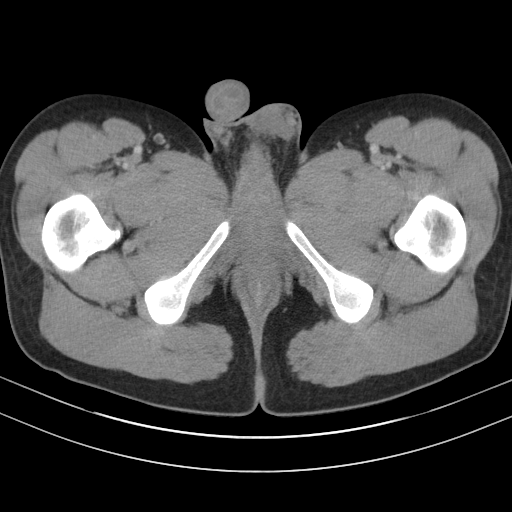
[im 21/95  soft-tissue]
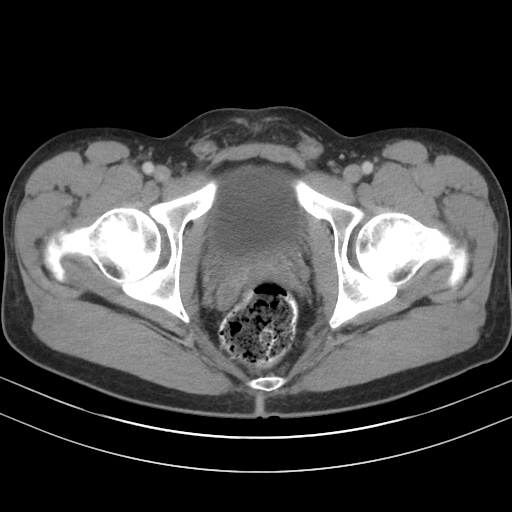
[im 27/95  soft-tissue]
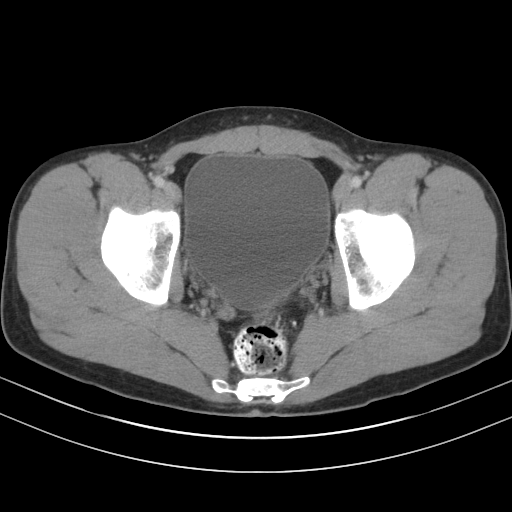
[im 32/95  soft-tissue]
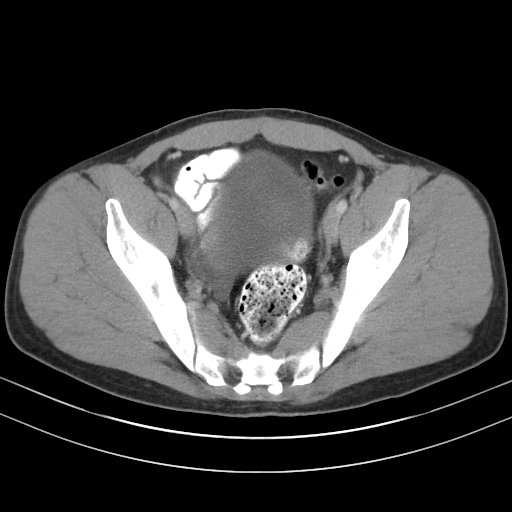
[im 42/95  soft-tissue]
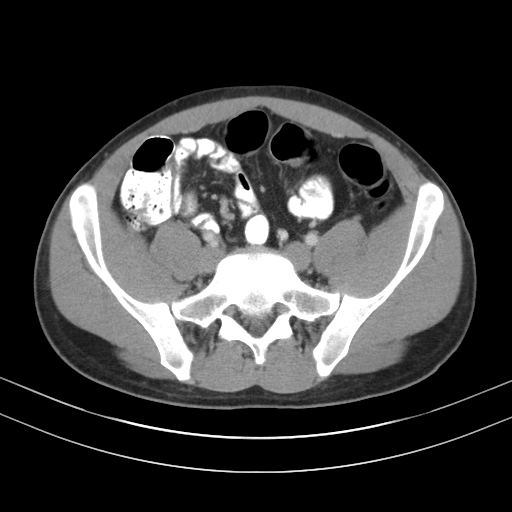
[im 48/95  soft-tissue]
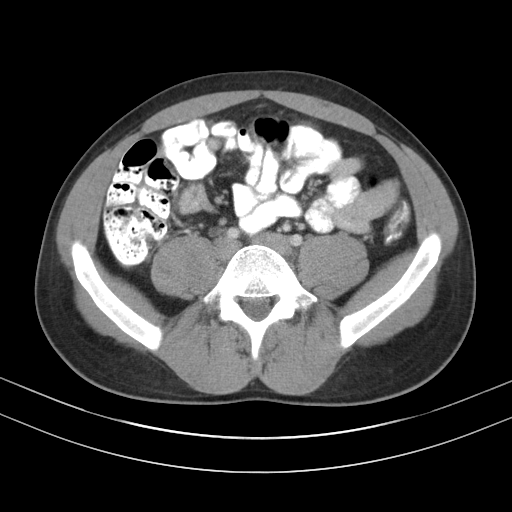
[im 53/95  soft-tissue]
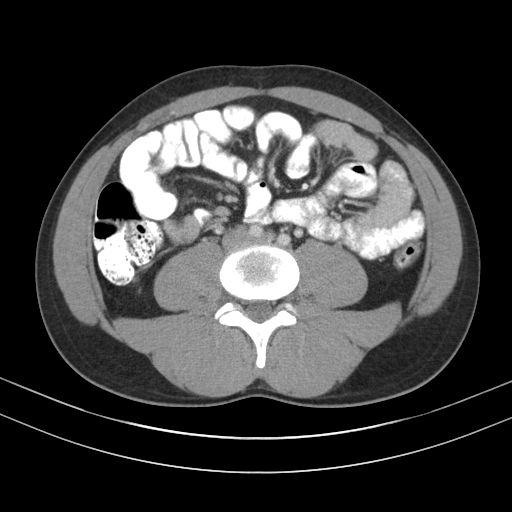
[im 63/95  soft-tissue]
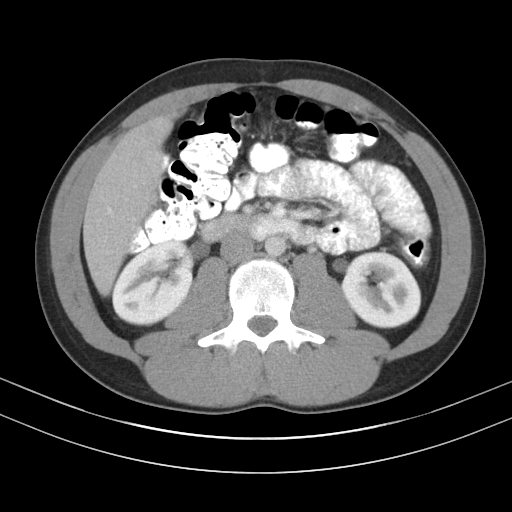
[im 63/95  bone]
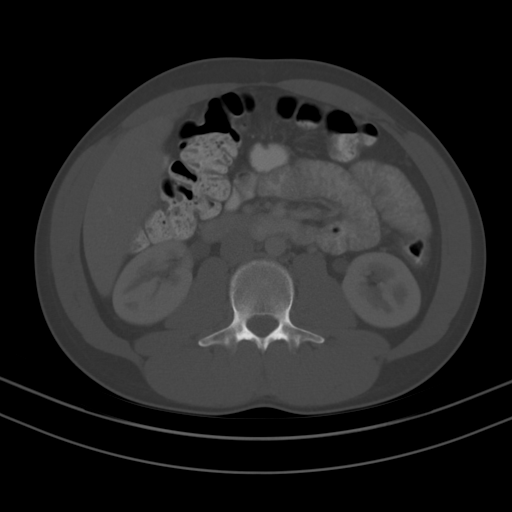
[im 68/95  soft-tissue]
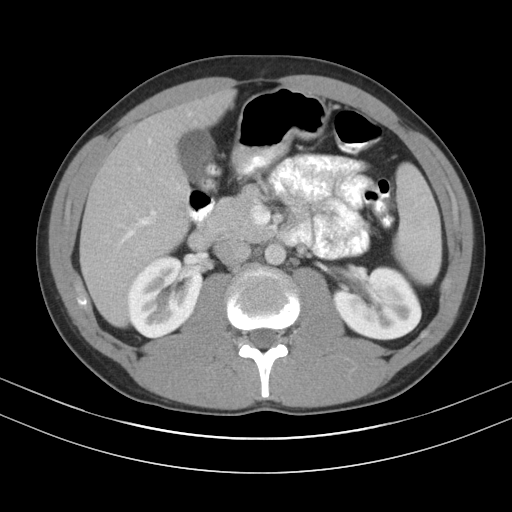
[im 74/95  soft-tissue]
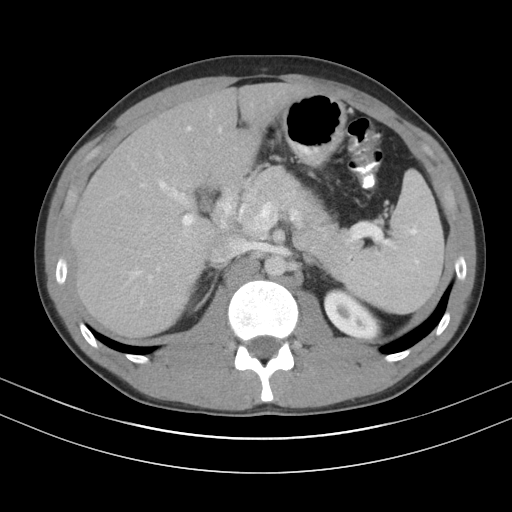
[im 74/95  lung]
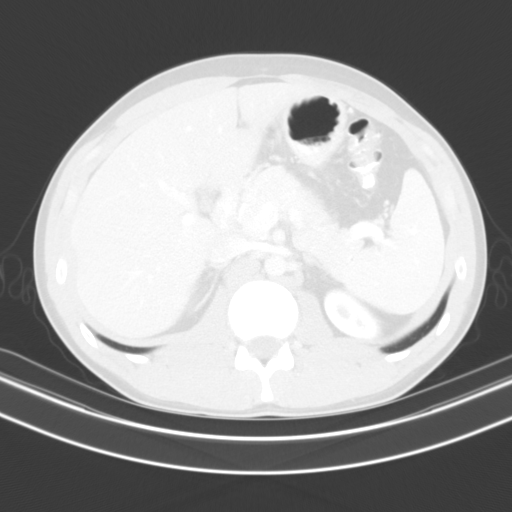
[im 79/95  lung]
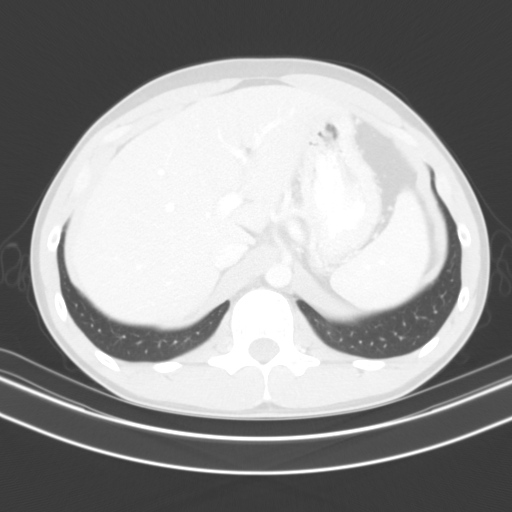
[im 84/95  soft-tissue]
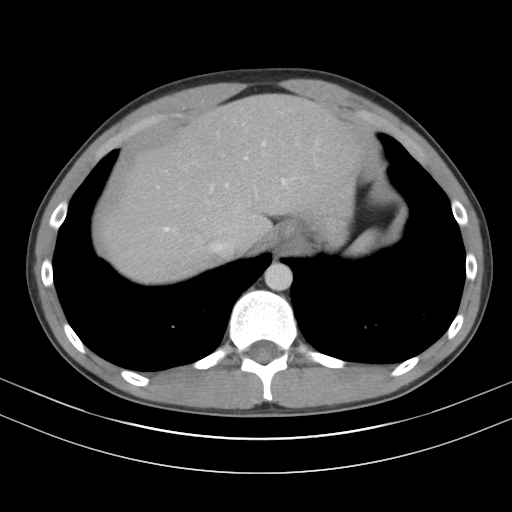
[im 84/95  lung]
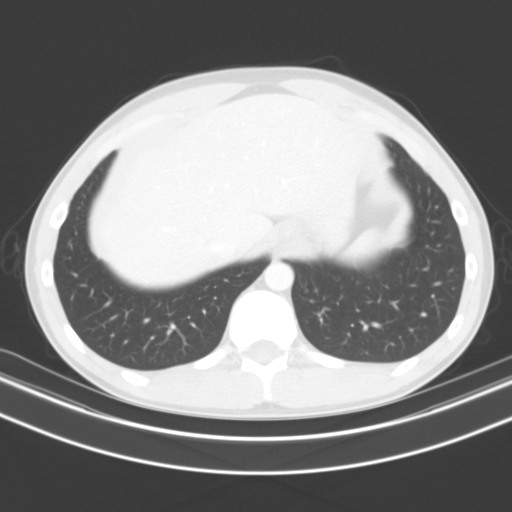
[im 89/95  soft-tissue]
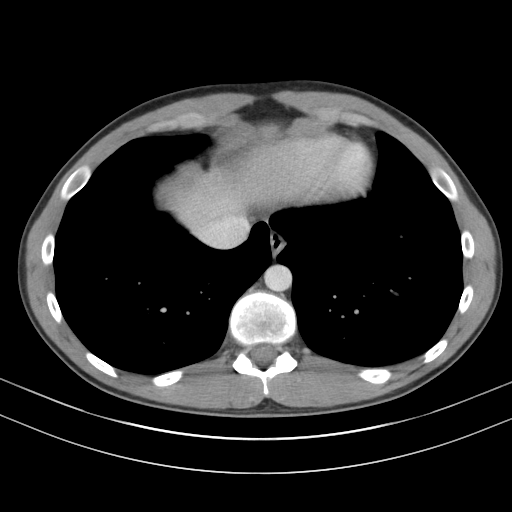
[im 89/95  lung]
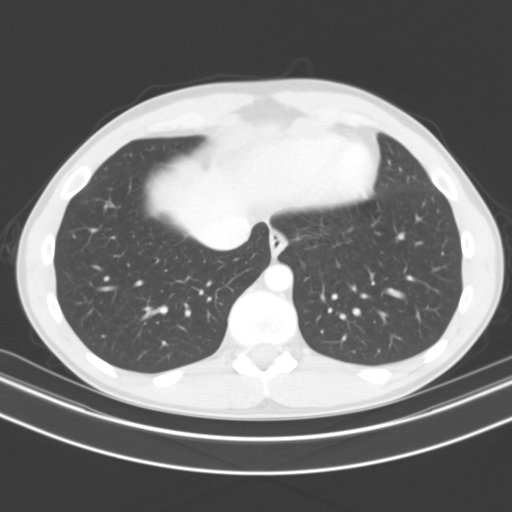

[14 of 32 positions shown; findings below may reference images not displayed]

RADIATION DOSE REDUCTION: This exam was performed according to the
departmental dose-optimization program which includes automated
exposure control, adjustment of the mA and/or kV according to
patient size and/or use of iterative reconstruction technique.

CONTRAST:  100mL WDFE2E-QBB IOPAMIDOL (WDFE2E-QBB) INJECTION 61%
FINDINGS: Lower chest: Lung bases are clear. No effusions. Heart is normal
size.

Hepatobiliary: No focal hepatic abnormality. Gallbladder
unremarkable.

Pancreas: No focal abnormality or ductal dilatation.

Spleen: No focal abnormality.  Normal size.

Adrenals/Urinary Tract: No adrenal abnormality. No focal renal
abnormality. No stones or hydronephrosis. Urinary bladder is
unremarkable.

Stomach/Bowel: Normal appendix. There is jejunal small bowel
intussusception noted on image 39 of series 2, also seen on coronal
image 31 and sagittal image 91. Second small bowel intussusception
is seen in the upper pelvis on image 52 of series 2, image 34
coronal study, and image 75 of sagittal series. No bowel
obstruction. Moderate stool throughout the colon. Stomach
decompressed, unremarkable. Moderate stool burden throughout the
colon.

Vascular/Lymphatic: No evidence of aneurysm or adenopathy.

Reproductive: No visible focal abnormality.

Other: No free fluid or free air.

Musculoskeletal: No acute bony abnormality.
IMPRESSION: Two left abdominal and upper pelvic small bowel intussusceptions.
These may be transient and incidental, but given the patient's left
side abdominal pain and lack of other explanation, this could be
followed with repeat CT in 1-2 months to ensure resolution. No bowel
obstruction.

## 2022-06-04 ENCOUNTER — Telehealth: Payer: Self-pay | Admitting: Nurse Practitioner

## 2022-06-04 ENCOUNTER — Encounter: Payer: Federal, State, Local not specified - PPO | Admitting: Nurse Practitioner

## 2022-06-04 NOTE — Telephone Encounter (Signed)
Pt was a no show for her cpe with Claris Gower on 06/04/22, I sent a no show letter.

## 2022-07-16 ENCOUNTER — Encounter: Payer: Self-pay | Admitting: Nurse Practitioner

## 2022-07-16 NOTE — Telephone Encounter (Signed)
Dismissal generated 

## 2022-07-16 NOTE — Telephone Encounter (Signed)
No show history 12/01/21 12/18/21 01/13/22 06/04/22  4th no show, pt was sent final warning letter after missed appt 01/13/22. Do you want me to proceed with dismissal?

## 2022-07-22 DIAGNOSIS — R112 Nausea with vomiting, unspecified: Secondary | ICD-10-CM | POA: Diagnosis not present

## 2022-07-22 DIAGNOSIS — R140 Abdominal distension (gaseous): Secondary | ICD-10-CM | POA: Diagnosis not present

## 2022-07-22 DIAGNOSIS — K561 Intussusception: Secondary | ICD-10-CM | POA: Diagnosis not present

## 2022-10-06 ENCOUNTER — Encounter: Payer: Self-pay | Admitting: Gastroenterology

## 2022-10-06 ENCOUNTER — Ambulatory Visit: Payer: Federal, State, Local not specified - PPO | Admitting: Gastroenterology

## 2022-10-06 ENCOUNTER — Other Ambulatory Visit (INDEPENDENT_AMBULATORY_CARE_PROVIDER_SITE_OTHER): Payer: Federal, State, Local not specified - PPO

## 2022-10-06 VITALS — BP 104/64 | HR 96 | Ht 70.5 in | Wt 154.0 lb

## 2022-10-06 DIAGNOSIS — R933 Abnormal findings on diagnostic imaging of other parts of digestive tract: Secondary | ICD-10-CM

## 2022-10-06 DIAGNOSIS — R1010 Upper abdominal pain, unspecified: Secondary | ICD-10-CM

## 2022-10-06 DIAGNOSIS — R630 Anorexia: Secondary | ICD-10-CM | POA: Diagnosis not present

## 2022-10-06 DIAGNOSIS — K5900 Constipation, unspecified: Secondary | ICD-10-CM | POA: Diagnosis not present

## 2022-10-06 LAB — COMPREHENSIVE METABOLIC PANEL
ALT: 14 U/L (ref 0–53)
AST: 25 U/L (ref 0–37)
Albumin: 4.9 g/dL (ref 3.5–5.2)
Alkaline Phosphatase: 63 U/L (ref 39–117)
BUN: 12 mg/dL (ref 6–23)
CO2: 32 mEq/L (ref 19–32)
Calcium: 9.6 mg/dL (ref 8.4–10.5)
Chloride: 101 mEq/L (ref 96–112)
Creatinine, Ser: 1.02 mg/dL (ref 0.40–1.50)
GFR: 104.92 mL/min (ref >60.00)
Glucose, Bld: 92 mg/dL (ref 70–99)
Potassium: 3.4 mEq/L — ABNORMAL LOW (ref 3.5–5.1)
Sodium: 139 mEq/L (ref 135–145)
Total Bilirubin: 0.5 mg/dL (ref 0.2–1.2)
Total Protein: 7.5 g/dL (ref 6.0–8.3)

## 2022-10-06 LAB — CBC WITH DIFFERENTIAL/PLATELET
Basophils Absolute: 0.1 10*3/uL (ref 0.0–0.1)
Basophils Relative: 0.7 % (ref 0.0–3.0)
Eosinophils Absolute: 0.4 10*3/uL (ref 0.0–0.7)
Eosinophils Relative: 4.5 % (ref 0.0–5.0)
HCT: 45.7 % (ref 39.0–52.0)
Hemoglobin: 15.6 g/dL (ref 13.0–17.0)
Lymphocytes Relative: 28.9 % (ref 12.0–46.0)
Lymphs Abs: 2.3 10*3/uL (ref 0.7–4.0)
MCHC: 34.1 g/dL (ref 30.0–36.0)
MCV: 88.5 fl (ref 78.0–100.0)
Monocytes Absolute: 0.6 10*3/uL (ref 0.1–1.0)
Monocytes Relative: 7.9 % (ref 3.0–12.0)
Neutro Abs: 4.6 10*3/uL (ref 1.4–7.7)
Neutrophils Relative %: 58 % (ref 43.0–77.0)
Platelets: 210 10*3/uL (ref 150.0–400.0)
RBC: 5.16 Mil/uL (ref 4.22–5.81)
RDW: 13 % (ref 11.5–15.5)
WBC: 8 10*3/uL (ref 4.0–10.5)

## 2022-10-06 LAB — LIPASE: Lipase: 28 U/L (ref 11.0–59.0)

## 2022-10-06 NOTE — Patient Instructions (Addendum)
Your provider has requested that you go to the basement level for lab work before leaving today. Press "B" on the elevator. The lab is located at the first door on the left as you exit the elevator.  You have been scheduled for a CT scan of the abdomen and pelvis at [facility], 1st floor Radiology. You are scheduled on 10/13/22 at 8:00am. You should arrive 30 minutes prior to your appointment time for registration.  NOTHING TO EAT OR DRINK 4 HOURS PRIOR TO SCAN.  Plan on being at [facility] for 45 minutes or longer, depending on the type of exam you are having performed.   If you have any questions regarding your exam or if you need to reschedule, you may call [facility] at [phone] between the hours of 8:00 am and 5:00 pm, Monday-Friday.    Increase your Miralax to 3 x daily and can titrate depending on bowel movements.   The [facility] providers would like to encourage you to use [facility] to communicate with providers for non-urgent requests or questions.  Due to long hold times on the telephone, sending your provider a message by [facility] may be a faster and more efficient way to get a response.  Please allow 48 business hours for a response.  Please remember that this is for non-urgent requests.   Due to recent changes in healthcare laws, you may see the results of your imaging and laboratory studies on MyChart before your provider has had a chance to review them.  We understand that in some cases there may be results that are confusing or concerning to you. Not all laboratory results come back in the same time frame and the provider may be waiting for multiple results in order to interpret others.  Please give [place] 48 hours in order for your provider to thoroughly review all the results before contacting the office for clarification of your results.   Thank you for choosing me and [facility].  Pricilla Riffle. Dagoberto Ligas., MD., [facility]

## 2022-10-06 NOTE — Progress Notes (Addendum)
    Assessment     Upper abdominal pain, decreased appetite and constipation Abnormal CT of the small bowel showing SB intussusceptions   Recommendations    Schedule CT AP to reassess for intussusception CMP, CBC, lipase Discontinue illicit substance use Resume MiraLAX titrated between 1 and 3 capfuls per day If symptoms persist consider colonoscopy and EGD for further evaluation REV in 3 months   HPI    This is a 21 year old male with upper abdominal pain, decreased appetite and constipation.  He states he tried MiraLAX once daily which helped his bowel movements for a while but no longer seem to be as helpful so he discontinued it.  He states he tried MiraLAX 1 capful per day for several weeks with initial improvement in his constipation and he felt it was no longer helping so he discontinued it.  He continues to use cocaine regularly and wonders whether this is contributing to his symptoms.  He has upper abdominal pain that radiates to the flanks and often avoids eating when the pain is more bothersome as he feels his symptoms will worsen.  Repeat CTAP was recommended at his office visit in April but was not completed.  He has been discharged from his [place] PCPs office multiple no-shows.   Labs / Imaging    CT AP 02/13/2022 Two left abdominal and upper pelvic small bowel intussusceptions. These may be transient and incidental, but given the patient's left side abdominal pain and lack of other explanation, this could be followed with repeat CT in 1-2 months to ensure resolution. No bowel obstruction.     Latest Ref Rng & Units 01/19/2022    3:29 PM 04/18/2021   10:09 AM  Hepatic Function  Total Protein 6.0 - 8.3 g/dL 7.7  7.3   Albumin 3.5 - 5.2 g/dL 4.9  4.8   AST 0 - 37 U/L 25  30   ALT 0 - 53 U/L 15  19   Alk Phosphatase 39 - 117 U/L 83  78   Total Bilirubin 0.2 - 1.2 mg/dL 0.5  0.5        Latest Ref Rng & Units 01/19/2022    3:29 PM 04/18/2021   10:09 AM  CBC  WBC 4.5  - 10.5 K/uL 7.9  8.2   Hemoglobin 13.0 - 17.0 g/dL 16.5  16.2   Hematocrit 39.0 - 52.0 % 48.8  45.7   Platelets 150.0 - 400.0 K/uL 201.0  193.0    Current Medications, Allergies, Past Medical History, Past Surgical History, Family History and Social History were reviewed in [employer] record.   Physical Exam: General: Well developed, well nourished, no acute distress Head: Normocephalic and atraumatic Eyes: Sclerae anicteric, EOMI Ears: Normal auditory acuity Mouth: Not examined Lungs: Clear throughout to auscultation Heart: Regular rate and rhythm; no murmurs, rubs or bruits Abdomen: Soft, non tender and non distended. No masses, hepatosplenomegaly or hernias noted. Normal Bowel sounds Rectal: Not done Musculoskeletal: Symmetrical with no gross deformities  Pulses:  Normal pulses noted Extremities: No clubbing, cyanosis, edema or deformities noted Neurological: Alert oriented x 4, grossly nonfocal Psychological:  Alert and cooperative. Normal mood and affect   Lyllian Gause T. Fuller Plan, MD 10/06/2022, 3:42 PM

## 2022-10-07 ENCOUNTER — Encounter: Payer: Self-pay | Admitting: Gastroenterology

## 2022-10-13 ENCOUNTER — Ambulatory Visit (HOSPITAL_COMMUNITY): Payer: Federal, State, Local not specified - PPO

## 2022-10-17 ENCOUNTER — Ambulatory Visit (HOSPITAL_COMMUNITY)
Admission: RE | Admit: 2022-10-17 | Discharge: 2022-10-17 | Disposition: A | Discharge status: Home / Self Care | Payer: Federal, State, Local not specified - PPO | Source: Ambulatory Visit | Attending: Gastroenterology | Admitting: Gastroenterology

## 2022-10-17 DIAGNOSIS — R1010 Upper abdominal pain, unspecified: Secondary | ICD-10-CM | POA: Diagnosis not present

## 2022-10-17 DIAGNOSIS — R109 Unspecified abdominal pain: Secondary | ICD-10-CM | POA: Diagnosis not present

## 2022-10-17 MED ORDER — IOHEXOL 300 MG/ML  SOLN
100.0000 mL | Freq: Once | INTRAMUSCULAR | Status: AC | PRN
  Administered 2022-10-17: 100 mL via INTRAVENOUS

## 2022-10-19 ENCOUNTER — Encounter: Payer: Self-pay | Admitting: Gastroenterology

## 2022-10-21 ENCOUNTER — Other Ambulatory Visit: Payer: Self-pay

## 2022-10-21 DIAGNOSIS — K561 Intussusception: Secondary | ICD-10-CM

## 2022-10-27 ENCOUNTER — Encounter: Payer: Self-pay | Admitting: Gastroenterology

## 2022-11-01 ENCOUNTER — Ambulatory Visit (HOSPITAL_COMMUNITY): Payer: Federal, State, Local not specified - PPO

## 2022-11-19 NOTE — Addendum Note (Signed)
Addended by: Chaney Malling on: 11/19/2022 08:53 AM   Modules accepted: Orders

## 2022-11-25 ENCOUNTER — Ambulatory Visit: Payer: Federal, State, Local not specified - PPO | Admitting: Gastroenterology

## 2022-12-16 DIAGNOSIS — K219 Gastro-esophageal reflux disease without esophagitis: Secondary | ICD-10-CM | POA: Diagnosis not present

## 2022-12-16 DIAGNOSIS — F331 Major depressive disorder, recurrent, moderate: Secondary | ICD-10-CM | POA: Diagnosis not present

## 2022-12-24 ENCOUNTER — Ambulatory Visit
Admission: RE | Admit: 2022-12-24 | Discharge: 2022-12-24 | Disposition: A | Discharge status: Home / Self Care | Payer: Federal, State, Local not specified - PPO | Source: Ambulatory Visit | Attending: Gastroenterology | Admitting: Gastroenterology

## 2022-12-24 DIAGNOSIS — Z8719 Personal history of other diseases of the digestive system: Secondary | ICD-10-CM | POA: Diagnosis not present

## 2022-12-24 DIAGNOSIS — R140 Abdominal distension (gaseous): Secondary | ICD-10-CM | POA: Diagnosis not present

## 2022-12-24 DIAGNOSIS — R109 Unspecified abdominal pain: Secondary | ICD-10-CM | POA: Diagnosis not present

## 2022-12-24 DIAGNOSIS — K561 Intussusception: Secondary | ICD-10-CM

## 2022-12-24 DIAGNOSIS — R112 Nausea with vomiting, unspecified: Secondary | ICD-10-CM | POA: Diagnosis not present

## 2022-12-24 MED ORDER — IOPAMIDOL (ISOVUE-300) INJECTION 61%
100.0000 mL | Freq: Once | INTRAVENOUS | Status: AC | PRN
  Administered 2022-12-24: 100 mL via INTRAVENOUS

## 2022-12-25 ENCOUNTER — Encounter: Payer: Self-pay | Admitting: Gastroenterology

## 2023-02-02 DIAGNOSIS — F331 Major depressive disorder, recurrent, moderate: Secondary | ICD-10-CM | POA: Diagnosis not present

## 2023-02-02 DIAGNOSIS — R140 Abdominal distension (gaseous): Secondary | ICD-10-CM | POA: Diagnosis not present

## 2023-02-03 ENCOUNTER — Encounter: Payer: Self-pay | Admitting: Gastroenterology

## 2023-02-23 ENCOUNTER — Encounter: Payer: Self-pay | Admitting: Gastroenterology

## 2023-02-23 ENCOUNTER — Ambulatory Visit: Payer: Federal, State, Local not specified - PPO | Admitting: Gastroenterology

## 2023-02-23 VITALS — BP 114/74 | HR 74 | Ht 71.0 in | Wt 163.8 lb

## 2023-02-23 DIAGNOSIS — R0789 Other chest pain: Secondary | ICD-10-CM | POA: Diagnosis not present

## 2023-02-23 DIAGNOSIS — R140 Abdominal distension (gaseous): Secondary | ICD-10-CM | POA: Diagnosis not present

## 2023-02-23 NOTE — Progress Notes (Signed)
    Assessment     Abdominal bloating, gas - R/O lactose intolerance, other dietary stressors Suspected chest wall pain   Recommendations    Lactose-free diet for 1 week and if symptoms are not resolved then low gas diet Increase Gas-X to 4 times daily as needed Ibuprofen 400 mg 3 times daily as needed for chest pain for 5 days.  If symptoms worsen or persist he is advised to contact his PCP. Return to PCP for ongoing care.  GI follow-up as needed   HPI    This is a 22 year old male who relates intestinal gas and bloating.  He regularly has milk products in his diet and he is concerned he may be lactose intolerant.  Gas-X daily has helped his symptoms.  We reviewed the results of his recent CT enterography which was normal, no longer showed a small bowel intussusception.  He relates regular bowel movements daily.  His appetite is good and his weight is stable.  He states he has been off recreational drugs for 6 months and feels well.  He was recently was started on Wellbutrin by his PCP.  He relates the onset of right sternal area chest pain when coughing or taking a deep breath for the past couple days.   Labs / Imaging    CT enterography 12/25/2022 Negative. No radiographic evidence of inflammatory bowel disease or other intestinal pathology. Previously seen small bowel intussusception is no longer visualized.      Latest Ref Rng & Units 10/06/2022    4:15 PM 01/19/2022    3:29 PM 04/18/2021   10:09 AM  Hepatic Function  Total Protein 6.0 - 8.3 g/dL 7.5  7.7  7.3   Albumin 3.5 - 5.2 g/dL 4.9  4.9  4.8   AST 0 - 37 U/L 25  25  30    ALT 0 - 53 U/L 14  15  19    Alk Phosphatase 39 - 117 U/L 63  83  78   Total Bilirubin 0.2 - 1.2 mg/dL 0.5  0.5  0.5        Latest Ref Rng & Units 10/06/2022    4:15 PM 01/19/2022    3:29 PM 04/18/2021   10:09 AM  CBC  WBC 4.0 - 10.5 K/uL 8.0  7.9  8.2   Hemoglobin 13.0 - 17.0 g/dL 15.6  16.5  16.2   Hematocrit 39.0 - 52.0 % 45.7  48.8  45.7    Platelets 150.0 - 400.0 K/uL 210.0  201.0  193.0    Current Medications, Allergies, Past Medical History, Past Surgical History, Family History and Social History were reviewed in [employer] record.   Physical Exam: General: Well developed, well nourished, no acute distress Head: Normocephalic and atraumatic Eyes: Sclerae anicteric, EOMI Ears: Normal auditory acuity Mouth: No deformities or lesions noted Lungs: Clear throughout to auscultation Heart: Regular rate and rhythm; No murmurs, rubs or bruits Abdomen: Soft, non tender and non distended. No masses, hepatosplenomegaly or hernias noted. Normal Bowel sounds Rectal: Not done Musculoskeletal: Symmetrical with no gross deformities  Pulses:  Normal pulses noted Extremities: No edema or deformities noted Neurological: Alert oriented x 4, grossly nonfocal Psychological:  Alert and cooperative. Normal mood and affect   Annet Manukyan T. Fuller Plan, MD 02/23/2023, 2:59 PM

## 2023-02-23 NOTE — Patient Instructions (Signed)
Start a lactose free diet x 1 week.   Stat a low gas diet and increase your over the counter Gas-x to four times a day as needed.   Lactose-Free Diet, Adult If you have lactose intolerance, you are not able to digest lactose. Lactose is a natural sugar found mainly in dairy milk and dairy products. A lactose-free diet can help you avoid foods and beverages that contain lactose. What are tips for following this plan? Reading food labels Do not consume foods, beverages, vitamins, minerals, or medicines containing lactose. Read ingredient lists carefully. Look for the words "lactose-free" on labels. Meal planning Use alternatives to dairy milk and foods made with milk products. These include the following: Lactose-free milk. Soy milk with added calcium and vitamin D. Almond milk, coconut milk, rice milk, or other nondairy milk alternatives with added calcium and vitamin D. Note that a lot of these are low in protein. Soy products, such as soy yogurt, soy cheese, soy ice cream, and soy-based sour cream. Other nut milk products, such as almond yogurt, almond cheese, cashew yogurt, cashew cheese, cashew ice cream, coconut yogurt, and coconut ice cream. Medicines, vitamins, and supplements Use lactase enzyme drops or tablets as directed by your health care provider. Make sure you get enough calcium and vitamin D in your diet. A lactose-free eating plan can be lacking in these important nutrients. Take calcium and vitamin D supplements as directed by your health care provider. Talk with your health care provider about supplements if you are not able to get enough calcium and vitamin D from food. What foods should I eat?  Fruits All fresh, canned, frozen, or dried fruits and fruit juices that are not processed with lactose. Vegetables All fresh, frozen, and canned vegetables without cheese, cream, or butter sauces. Grains Any that are not made with dairy milk or dairy products. Meats and other  proteins Any meat, fish, poultry, and other protein sources that are not made with dairy milk or dairy products. Fats and oils Any that are not made with dairy milk or dairy products. Sweets and desserts Any that are not made with dairy milk or dairy products. Seasonings and condiments Any that are not made with dairy milk or dairy products. Calcium Calcium is found in many foods that contain lactose and is important for bone health. The amount of calcium you need depends on your age: Adults younger than 50 years: 1,000 mg of calcium a day. Adults older than 50 years: 1,200 mg of calcium a day. If you are not getting enough calcium, you may get it from other sources, including: Orange juice that has been fortified with calcium. This means that calcium has been added to the product. There are 300-350 mg of calcium in 1 cup (237 mL) of calcium-fortified orange juice. Soy milk fortified with calcium. There are 300-400 mg of calcium in 1 cup (237 mL) of calcium-fortified soy milk. Rice or almond milk fortified with calcium. There are 300 mg of calcium in 1 cup (237 mL) of calcium-fortified rice or almond milk. Breakfast cereals fortified with calcium. There are 100-1,000 mg of calcium in calcium-fortified breakfast cereals. Spinach, cooked. There are 145 mg of calcium in  cup (90 g) of cooked spinach. Edamame, cooked. There are 130 mg of calcium in  cup (47 g) of cooked edamame. Collard greens, cooked. There are 125 mg of calcium in  cup (85 g) of cooked collard greens. Kale, frozen or cooked. There are 90 mg of calcium in  cup (59 g) of cooked or frozen kale. Almonds. There are 95 mg of calcium in  cup (35 g) of almonds. Broccoli, cooked. There are 60 mg of calcium in 1 cup (156 g) of cooked broccoli. The items listed above may not be a complete list of foods and beverages you can eat. Contact a dietitian for more options. What foods should I avoid? Lactose is found in dairy milk and dairy  products, such as: Yogurt. Cheese. Butter. Margarine. Sour cream. Cream. Whipped toppings and creamers. Ice cream and other dairy-based desserts. Lactose is also found in foods or products made with dairy milk or milk ingredients. To find out whether a food contains dairy milk or a milk ingredient, look at the ingredients list. Avoid foods with the statement "May contain milk" and foods that contain: Milk powder. Whey. Curd. Lactose. Lactoglobulin. The items listed above may not be a complete list of foods and beverages to avoid. Contact a dietitian for more information. Where to find more information [employer] of Diabetes and Digestive and Kidney Diseases: [email or web address] Summary If you are lactose intolerant, it means that you are not able to digest lactose, a natural sugar found in milk and milk products. Following a lactose-free diet can help you manage this condition. Calcium is important for bone health and is found in many foods that contain lactose. Talk with your health care provider about other sources of calcium. This information is not intended to replace advice given to you by your health care provider. Make sure you discuss any questions you have with your health care provider. Document Revised: 11/05/2020 Document Reviewed: 11/05/2020 Elsevier Patient Education  [place].

## 2023-02-25 DIAGNOSIS — J029 Acute pharyngitis, unspecified: Secondary | ICD-10-CM | POA: Diagnosis not present

## 2023-02-25 DIAGNOSIS — M940 Chondrocostal junction syndrome [Tietze]: Secondary | ICD-10-CM | POA: Diagnosis not present

## 2023-02-25 DIAGNOSIS — R0981 Nasal congestion: Secondary | ICD-10-CM | POA: Diagnosis not present

## 2023-02-25 DIAGNOSIS — R051 Acute cough: Secondary | ICD-10-CM | POA: Diagnosis not present

## 2023-02-25 DIAGNOSIS — M791 Myalgia, unspecified site: Secondary | ICD-10-CM | POA: Diagnosis not present

## 2023-03-22 DIAGNOSIS — M26629 Arthralgia of temporomandibular joint, unspecified side: Secondary | ICD-10-CM | POA: Diagnosis not present

## 2023-03-22 DIAGNOSIS — M25522 Pain in left elbow: Secondary | ICD-10-CM | POA: Diagnosis not present

## 2023-03-22 DIAGNOSIS — J301 Allergic rhinitis due to pollen: Secondary | ICD-10-CM | POA: Diagnosis not present

## 2023-03-22 DIAGNOSIS — F331 Major depressive disorder, recurrent, moderate: Secondary | ICD-10-CM | POA: Diagnosis not present

## 2023-06-25 DIAGNOSIS — R109 Unspecified abdominal pain: Secondary | ICD-10-CM | POA: Diagnosis not present

## 2023-06-25 DIAGNOSIS — R051 Acute cough: Secondary | ICD-10-CM | POA: Diagnosis not present

## 2023-06-25 DIAGNOSIS — K2900 Acute gastritis without bleeding: Secondary | ICD-10-CM | POA: Diagnosis not present

## 2023-06-25 DIAGNOSIS — R0602 Shortness of breath: Secondary | ICD-10-CM | POA: Diagnosis not present

## 2023-06-25 DIAGNOSIS — R062 Wheezing: Secondary | ICD-10-CM | POA: Diagnosis not present

## 2023-07-10 DIAGNOSIS — R051 Acute cough: Secondary | ICD-10-CM | POA: Diagnosis not present

## 2023-07-10 DIAGNOSIS — R062 Wheezing: Secondary | ICD-10-CM | POA: Diagnosis not present

## 2023-08-03 ENCOUNTER — Encounter: Payer: Self-pay | Admitting: Gastroenterology

## 2023-08-18 DIAGNOSIS — F411 Generalized anxiety disorder: Secondary | ICD-10-CM | POA: Diagnosis not present

## 2023-08-18 DIAGNOSIS — F410 Panic disorder [episodic paroxysmal anxiety] without agoraphobia: Secondary | ICD-10-CM | POA: Diagnosis not present

## 2023-08-18 DIAGNOSIS — F331 Major depressive disorder, recurrent, moderate: Secondary | ICD-10-CM | POA: Diagnosis not present

## 2023-08-18 DIAGNOSIS — R052 Subacute cough: Secondary | ICD-10-CM | POA: Diagnosis not present

## 2023-11-05 DIAGNOSIS — J3089 Other allergic rhinitis: Secondary | ICD-10-CM | POA: Diagnosis not present

## 2023-11-05 DIAGNOSIS — R052 Subacute cough: Secondary | ICD-10-CM | POA: Diagnosis not present
